# Patient Record
Sex: Female | Born: 1990 | Race: Black or African American | Hispanic: No | Marital: Married | Smoking: Former smoker
Health system: Southern US, Community
[De-identification: ages and names within clinical notes are randomized; demographics above are authoritative.]

## PROBLEM LIST (undated history)

## (undated) ENCOUNTER — Inpatient Hospital Stay (HOSPITAL_COMMUNITY): Payer: Self-pay

## (undated) DIAGNOSIS — O139 Gestational [pregnancy-induced] hypertension without significant proteinuria, unspecified trimester: Secondary | ICD-10-CM

## (undated) DIAGNOSIS — F419 Anxiety disorder, unspecified: Secondary | ICD-10-CM

## (undated) DIAGNOSIS — N83209 Unspecified ovarian cyst, unspecified side: Secondary | ICD-10-CM

## (undated) DIAGNOSIS — B977 Papillomavirus as the cause of diseases classified elsewhere: Secondary | ICD-10-CM

## (undated) DIAGNOSIS — R87629 Unspecified abnormal cytological findings in specimens from vagina: Secondary | ICD-10-CM

## (undated) DIAGNOSIS — Z8619 Personal history of other infectious and parasitic diseases: Secondary | ICD-10-CM

## (undated) HISTORY — PX: OTHER SURGICAL HISTORY: SHX169

## (undated) HISTORY — PX: WISDOM TOOTH EXTRACTION: SHX21

## (undated) HISTORY — DX: Gestational (pregnancy-induced) hypertension without significant proteinuria, unspecified trimester: O13.9

---

## 2010-10-05 ENCOUNTER — Encounter
Admission: RE | Admit: 2010-10-05 | Discharge: 2010-10-05 | Payer: Self-pay | Source: Home / Self Care | Attending: Obstetrics and Gynecology | Admitting: Obstetrics and Gynecology

## 2011-11-27 ENCOUNTER — Emergency Department (HOSPITAL_COMMUNITY)
Admission: EM | Admit: 2011-11-27 | Discharge: 2011-11-27 | Disposition: A | Discharge status: Home / Self Care | Payer: 59 | Attending: Emergency Medicine | Admitting: Emergency Medicine

## 2011-11-27 ENCOUNTER — Encounter (HOSPITAL_COMMUNITY): Payer: Self-pay | Admitting: Emergency Medicine

## 2011-11-27 DIAGNOSIS — E860 Dehydration: Secondary | ICD-10-CM | POA: Insufficient documentation

## 2011-11-27 DIAGNOSIS — R10819 Abdominal tenderness, unspecified site: Secondary | ICD-10-CM | POA: Insufficient documentation

## 2011-11-27 DIAGNOSIS — N946 Dysmenorrhea, unspecified: Secondary | ICD-10-CM

## 2011-11-27 DIAGNOSIS — R112 Nausea with vomiting, unspecified: Secondary | ICD-10-CM | POA: Insufficient documentation

## 2011-11-27 DIAGNOSIS — K117 Disturbances of salivary secretion: Secondary | ICD-10-CM | POA: Insufficient documentation

## 2011-11-27 DIAGNOSIS — F17200 Nicotine dependence, unspecified, uncomplicated: Secondary | ICD-10-CM | POA: Insufficient documentation

## 2011-11-27 DIAGNOSIS — R109 Unspecified abdominal pain: Secondary | ICD-10-CM | POA: Insufficient documentation

## 2011-11-27 DIAGNOSIS — F411 Generalized anxiety disorder: Secondary | ICD-10-CM | POA: Insufficient documentation

## 2011-11-27 DIAGNOSIS — R197 Diarrhea, unspecified: Secondary | ICD-10-CM

## 2011-11-27 LAB — COMPREHENSIVE METABOLIC PANEL
AST: 17 U/L (ref 0–37)
BUN: 5 mg/dL — ABNORMAL LOW (ref 6–23)
CO2: 24 mEq/L (ref 19–32)
Calcium: 8.7 mg/dL (ref 8.4–10.5)
Creatinine, Ser: 0.76 mg/dL (ref 0.50–1.10)
GFR calc Af Amer: >90 mL/min (ref >90)
GFR calc non Af Amer: >90 mL/min (ref >90)
Glucose, Bld: 129 mg/dL — ABNORMAL HIGH (ref 70–99)
Total Bilirubin: 0.4 mg/dL (ref 0.3–1.2)

## 2011-11-27 LAB — HCG, QUANTITATIVE, PREGNANCY: hCG, Beta Chain, Quant, S: <1 m[IU]/mL (ref <5)

## 2011-11-27 LAB — DIFFERENTIAL
Basophils Absolute: 0 10*3/uL (ref 0.0–0.1)
Eosinophils Relative: 1 % (ref 0–5)
Lymphocytes Relative: 16 % (ref 12–46)
Monocytes Absolute: 0.4 10*3/uL (ref 0.1–1.0)
Monocytes Relative: 6 % (ref 3–12)

## 2011-11-27 LAB — CBC
HCT: 35.9 % — ABNORMAL LOW (ref 36.0–46.0)
Hemoglobin: 12.6 g/dL (ref 12.0–15.0)
MCV: 91.1 fL (ref 78.0–100.0)
RDW: 13 % (ref 11.5–15.5)
WBC: 6.2 10*3/uL (ref 4.0–10.5)

## 2011-11-27 MED ORDER — ONDANSETRON HCL 4 MG/2ML IJ SOLN
4.0000 mg | INTRAMUSCULAR | Status: DC | PRN
  Administered 2011-11-27: 4 mg via INTRAVENOUS
  Filled 2011-11-27: qty 2

## 2011-11-27 MED ORDER — SODIUM CHLORIDE 0.9 % IV SOLN: INTRAVENOUS | Status: DC

## 2011-11-27 MED ORDER — SODIUM CHLORIDE 0.9 % IV BOLUS (SEPSIS)
1000.0000 mL | Freq: Once | INTRAVENOUS | Status: AC
  Administered 2011-11-27: 1000 mL via INTRAVENOUS

## 2011-11-27 MED ORDER — PROMETHAZINE HCL 25 MG RE SUPP: 25.0000 mg | Freq: Four times a day (QID) | RECTAL | Status: AC | PRN

## 2011-11-27 MED ORDER — PROMETHAZINE HCL 25 MG/ML IJ SOLN
25.0000 mg | Freq: Once | INTRAMUSCULAR | Status: AC
  Administered 2011-11-27: 25 mg via INTRAVENOUS
  Filled 2011-11-27: qty 1

## 2011-11-27 NOTE — Discharge Instructions (Signed)
Drink plenty of fluids (clear liquids) the next 12-24 hours then start the BRAT diet. . Use the phenergan for nausea or vomiting. Take imodium OTC for diarrhea. Avoid mild products until the diarrhea is gone. Recheck if you get worse again.

## 2011-11-27 NOTE — ED Notes (Signed)
ZOX:[id number]<AV> Expected date:11/27/11<BR> Expected time: 2:43 PM<BR> Means of arrival:Ambulance<BR> Comments:<BR> N/v/fever

## 2011-11-27 NOTE — ED Notes (Signed)
Pt states she got her period today and thought she was cramping.  She began having bilateral lower quadrant pain.  N/V/D

## 2011-11-27 NOTE — ED Notes (Signed)
Left A/C IV removed. Catheter intact at this time. The patients vitals taken d/c instructions given

## 2011-11-27 NOTE — ED Provider Notes (Signed)
History     CSN: [id number]  Arrival date & time 11/27/11  1455   First MD Initiated Contact with Patient 11/27/11 1503      Chief Complaint  Patient presents with  . Nausea  . Emesis  . Diarrhea    (Consider location/radiation/quality/duration/timing/severity/associated sxs/prior treatment) HPI  Patient relates sometimes about once year she will have a lot of lower abdominal pain with nausea and diarrhea when she starts her period. She relates she started her period this morning. 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 She states to have some cramping to a she's had nausea with vomiting x3 and diarrhea x5 that is loose and watery. She denies being around anybody else is sick. She states the pain started in the epigastric area and now is in the lower abdomen. She states she is G0 P0 and her last normal period was about 24 days ago. She states she does use condoms for birth control.  PCP none  History reviewed. No pertinent past medical history.  No past surgical history on file.  No family history on file.  History  Substance Use Topics  . Smoking status: Current Everyday Smoker  . Smokeless tobacco: Not on file  . Alcohol Use: Yes  employed  OB History    Grav Para Term Preterm Abortions TAB SAB Ect Mult Living                  Review of Systems  All other systems reviewed and are negative.    Allergies  Review of patient's allergies indicates no known allergies.  Home Medications   Current Outpatient Rx  Name Route Sig Dispense Refill  . PROMETHAZINE HCL 25 MG RE SUPP Rectal Place 1 suppository (25 mg total) rectally every 6 (six) hours as needed for nausea. 6 each 0    BP 112/60  Pulse 60  Temp 97.6 F (36.4 C)  LMP 11/27/2011  Vital signs normal     Physical Exam  Nursing note and vitals reviewed. Constitutional: She is oriented to person, place, and time. She appears well-developed and well-nourished.  Non-toxic appearance. She does not appear ill. She appears distressed.       Patient has just vomited one into the room.  HENT:  Head: Normocephalic and atraumatic.  Right Ear: External ear normal.  Left Ear: External ear normal.  Nose: Nose normal. No mucosal edema or rhinorrhea.  Mouth/Throat: Mucous membranes are normal. No dental abscesses or uvula swelling.       Mucous membranes dry  Eyes: Conjunctivae and EOM are normal. Pupils are equal, round, and reactive to light.  Neck: Normal range of motion and full passive range of motion without pain. Neck supple.  Cardiovascular: Normal rate, regular rhythm and normal heart sounds.  Exam reveals no gallop and no friction rub.   No murmur heard. Pulmonary/Chest: Effort normal and breath sounds normal. No respiratory distress. She has no wheezes. She has no rhonchi. She has no rales. She exhibits no tenderness and no crepitus.  Abdominal: Soft. Normal appearance and bowel sounds are normal. She exhibits no distension. There is tenderness. There is no rebound and no guarding.       Her abdomen is soft with diffuse tenderness without guarding or rebound. She has no focal tenderness on exam.  Musculoskeletal: Normal range of motion. She exhibits no edema and no tenderness.       Moves all extremities well.   Neurological: She is alert and oriented to person, place, and time. She has normal strength. No cranial nerve deficit.  Skin: Skin is warm, dry and intact. No rash noted. No erythema. No pallor.  Psychiatric: Her speech is normal and behavior is normal. Her mood appears not anxious.       Anxious    ED Course  Procedures (including critical care time)   Medications  0.9 %  sodium chloride infusion (not administered)  ondansetron (ZOFRAN) injection 4 mg (4 mg Intravenous Given  11/27/11 1532)  sodium chloride 0.9 % bolus 1,000 mL (1000 mL Intravenous Given 11/27/11 1533)  promethazine (PHENERGAN) injection 25 mg (25 mg Intravenous Given 11/27/11 1618)  sodium chloride 0.9 % bolus 1,000 mL (1000 mL Intravenous Given 11/27/11 1754)   Recheck 1700 sleeping, no more vomiting, will give another bolus of fluids.   18:30 walking to the bathroom, smiling, states she is feeling a lot better.    Results for orders placed during the hospital encounter of 11/27/11  CBC      Component Value Range   WBC 6.2  4.0 - 10.5 (K/uL)   RBC 3.94  3.87 - 5.11 (MIL/uL)   Hemoglobin 12.6  12.0 - 15.0 (g/dL)   HCT 16.1 (*) 09.6 - 46.0 (%)   MCV 91.1  78.0 - 100.0 (fL)   MCH 32.0  26.0 - 34.0 (pg)   MCHC 35.1  30.0 - 36.0 (g/dL)   RDW 04.5  40.9 - 81.1 (%)   Platelets 140 (*) 150 - 400 (K/uL)  DIFFERENTIAL      Component Value Range   Neutrophils Relative 76  43 - 77 (%)   Neutro Abs 4.8  1.7 - 7.7 (K/uL)   Lymphocytes Relative 16  12 - 46 (%)   Lymphs Abs 1.0  0.7 - 4.0 (K/uL)   Monocytes Relative 6  3 - 12 (%)   Monocytes Absolute 0.4  0.1 - 1.0 (K/uL)   Eosinophils Relative 1  0 - 5 (%)   Eosinophils Absolute 0.1  0.0 - 0.7 (K/uL)   Basophils Relative 0  0 - 1 (%)   Basophils Absolute 0.0  0.0 - 0.1 (K/uL)  COMPREHENSIVE METABOLIC PANEL      Component Value Range   Sodium 142  135 - 145 (mEq/L)   Potassium 3.6  3.5 - 5.1 (mEq/L)   Chloride 109  96 - 112 (mEq/L)   CO2 24  19 - 32 (mEq/L)   Glucose, Bld 129 (*) 70 - 99 (mg/dL)   BUN 5 (*) 6 - 23 (mg/dL)   Creatinine, Ser 9.14  0.50 - 1.10 (mg/dL)   Calcium 8.7  8.4 - 78.2 (mg/dL)   Total Protein 6.7  6.0 - 8.3 (g/dL)   Albumin 4.2  3.5 - 5.2 (g/dL)   AST 17  0 - 37 (U/L)   ALT 11  0 - 35 (U/L)   Alkaline Phosphatase 63  39 - 117 (U/L)   Total Bilirubin 0.4  0.3 - 1.2 (mg/dL)   GFR calc non Af Amer >90  >90 (mL/min)   GFR calc Af Amer >90  >90 (mL/min)  HCG, QUANTITATIVE, PREGNANCY      Component Value Range   hCG,  Beta Chain, Quant, S <1  <5 (mIU/mL)   Laboratory interpretation all normal   No results found.  1. Nausea vomiting and diarrhea   2. Dehydration   3. Menses painful    New Prescriptions   PROMETHAZINE (PHENERGAN) 25 MG SUPPOSITORY    Place 1 suppository (25 mg total) rectally every 6 (six) hours as needed for nausea.   Plan discharge Devoria Albe, MD, FACEP    MDM          Ward Givens, MD 11/27/11 [phone]

## 2014-01-20 ENCOUNTER — Ambulatory Visit (INDEPENDENT_AMBULATORY_CARE_PROVIDER_SITE_OTHER): Payer: BC Managed Care – PPO | Admitting: Family Medicine

## 2014-01-20 VITALS — BP 130/90 | HR 71 | Temp 98.1°F | Resp 16 | Ht 60.75 in | Wt 114.0 lb

## 2014-01-20 DIAGNOSIS — N926 Irregular menstruation, unspecified: Secondary | ICD-10-CM

## 2014-01-20 DIAGNOSIS — N912 Amenorrhea, unspecified: Secondary | ICD-10-CM

## 2014-01-20 LAB — POCT URINE PREGNANCY: Preg Test, Ur: NEGATIVE

## 2014-01-20 NOTE — Progress Notes (Signed)
This chart was scribed for Elvina SidleKurt Lauenstein, MD by Nicholos Johnsenise Iheanachor, Medical Scribe. This patient's care was started at 5:06 PM   Patient ID: Sherry CrockerSherrena Pena MRN: [id number], DOB: 02/11/1991, 23 y.o. Date of Encounter: 01/20/2014, 5:05 PM  Primary Physician: No PCP Per Patient  Chief Complaint: Pregnancy Test  HPI: 23 y.o. year old female with history below presents with a late period; says she is never late; if anything she is usually early. States she is having intermittent abdominal cramping and feels hungrier than normal. Pt is hoping she is pregnant; states this will be her first child if positive. Last period was December 20, 2013. Says she took two home pregnancy tests that both came back negative. [facility] because her mother tested negative using a home pregnancy test up until her 5th month of pregnancy.  States she is happy and not stressed. 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 Denies vaginal bleeding.   History reviewed. No pertinent past medical history.   Home Meds: Prior to Admission medications   Not on File    Allergies: No Known Allergies  History   Social History   Marital Status: Single    Spouse Name: N/A    Number of Children: N/A   Years of Education: N/A   Occupational History   Not on file.   Social History Main Topics   Smoking status: Current Every Day Smoker   Smokeless tobacco: Not on file   Alcohol Use: Yes   Drug Use: Yes    Special: Marijuana   Sexual Activity:    Other Topics Concern   Not on file   Social History Narrative   No narrative on file     Review of Systems: Constitutional: negative for chills, fever, night sweats, weight changes, or fatigue  HEENT: negative for vision changes, hearing loss, congestion, rhinorrhea, ST, epistaxis, or sinus pressure Cardiovascular: negative for chest pain or palpitations Respiratory: negative for hemoptysis, wheezing, shortness of  breath, or cough Abdominal: negative for nausea, vomiting, diarrhea, or constipation. Abdominal cramping Dermatological: negative for rash Neurologic: negative for headache, dizziness, or syncope All other systems reviewed and are otherwise negative with the exception to those above and in the HPI.   Physical Exam: Blood pressure 130/90, pulse 71, temperature 98.1 F (36.7 C), temperature source Oral, resp. rate 16, height 5' 0.75" (1.543 m), weight 114 lb (51.71 kg), last menstrual period 12/20/2013, SpO2 99.00%., Body mass index is 21.72 kg/(m^2). General: Well developed, well nourished, in no acute distress. Head: Normocephalic, atraumatic, eyes without discharge, sclera non-icteric, nares are without discharge. Bilateral auditory canals clear, TM's are without perforation, pearly grey and translucent with reflective cone of light bilaterally. Oral cavity moist, posterior pharynx without exudate, erythema, peritonsillar abscess, or post nasal drip.  Neck: Supple. No thyromegaly. Full ROM. No lymphadenopathy. Lungs: Clear bilaterally to auscultation without wheezes, rales, or rhonchi. Breathing is unlabored. Heart: RRR with S1 S2. No murmurs, rubs, or gallops appreciated. Abdomen: Soft, non-tender, non-distended with normoactive bowel sounds. No hepatomegaly. No rebound/guarding. No obvious abdominal masses. Msk:  Strength and tone normal for age. Extremities/Skin: Warm and dry. No clubbing or cyanosis. No edema. No rashes or suspicious lesions. Neuro: Alert and oriented X 3. Moves all extremities spontaneously. Gait is normal. CNII-XII grossly in tact. Psych:  Responds to questions appropriately with a normal affect.   Labs: Results for orders placed in visit on 01/20/14  POCT URINE PREGNANCY  Result Value Ref Range   Preg Test, Ur Negative       ASSESSMENT AND PLAN:  23 y.o. year old female with amenorrhea.   Missed period - Plan: POCT urine pregnancy, hCG, quantitative,  pregnancy  Signed, Elvina SidleKurt Lauenstein, MD     Signed, Elvina SidleKurt Lauenstein, MD 01/20/2014 5:05 PM

## 2014-01-20 NOTE — Patient Instructions (Signed)
Primary Amenorrhea  Primary amenorrhea is the absence of any menstrual flow in a female by the age of 15 years. An average age for the start of menstruation is the age of 12 years. Primary amenorrhea is not considered to have occurred until a female is older than 15 years and has never menstruated. This may occur with or without other signs of puberty. CAUSES  Some common causes of not menstruating include:  Chromosomal abnormality causing the ovaries to malfunction is the most common cause of primary amenorrhea.  Malnutrition.  Low blood sugar (hypoglycemia).  Polycystic ovary syndrome (cysts in the ovaries, not ovulating).  Absence of the vagina, uterus, or ovaries since birth (congenital).  Extreme obesity.  Cystic fibrosis.  Drastic weight loss from any cause.  Over-exercising (running, biking) causing loss of body fat.  Pituitary gland tumor in the brain.  Long-term (chronic) illnesses.  Cushing disease.  Thyroid disease (hypothyroidism, hyperthyroidism).  Part of the brain (hypothalamus) not functioning normally.  Premature ovarian failure. SYMPTOMS  No menstruation by age 15 years in normally developed females is the primary symptom. Other symptoms may include:  Discharge from the breasts.  Hot flashes.  Adult acne.  Facial or chest hair.  Headaches.  Impaired vision.  Recent stress.  Changes in weight, diet, or exercise patterns. DIAGNOSIS  Primary amenorrhea is diagnosed with the help of a medical history and a physical exam. Other tests that may be recommended include:  Blood tests to check for pregnancy, hormonal changes, a bleeding or thyroid disorder, low iron levels (anemia), or other problems.  Urine tests.  Specialized X-ray exams. TREATMENT  Treatment will depend on the cause. For example, some of the causes of primary amenorrhea, such as congenital absence of sex organs, will require surgery to correct. Others may respond to treatment  with medicine. SEEK MEDICAL CARE IF:  There has not been any menstrual flow by age 15 years.  Body maturation does not occur at a level typical of peers.  Pelvic area pain occurs.  There is unusual weight gain or hair growth. Document Released: 08/27/2005 Document Revised: 06/17/2013 Document Reviewed: 04/08/2013 ExitCare Patient Information 2014 [employer].  

## 2014-01-21 LAB — HCG, QUANTITATIVE, PREGNANCY: hCG, Beta Chain, Quant, S: 10 m[IU]/mL

## 2014-01-23 ENCOUNTER — Ambulatory Visit (INDEPENDENT_AMBULATORY_CARE_PROVIDER_SITE_OTHER): Payer: BC Managed Care – PPO | Admitting: Internal Medicine

## 2014-01-23 VITALS — BP 110/80 | HR 69 | Temp 98.3°F | Resp 16 | Ht 60.5 in | Wt 112.4 lb

## 2014-01-23 DIAGNOSIS — Z3201 Encounter for pregnancy test, result positive: Secondary | ICD-10-CM

## 2014-01-23 DIAGNOSIS — N912 Amenorrhea, unspecified: Secondary | ICD-10-CM

## 2014-01-23 LAB — HCG, QUANTITATIVE, PREGNANCY: HCG, BETA CHAIN, QUANT, S: 15.1 m[IU]/mL

## 2014-01-23 NOTE — Progress Notes (Signed)
   Subjective:    Patient ID: Sherry Pena, female    DOB: 04/07/1991, 23 y.o.   MRN: [id number]  This chart was scribed for Ellamae Siaobert Dakwan Pridgen, MD by Bronson CurbJacqueline Melvin, ED Scribe. This patient was seen in room Room 8 and the patient's care was started at 9:22 AM.   HPI  HPI Comments: Sherry Pena is a 23 y.o. female who returns to the [facility] for repeat quant pregnancy test. See last OV with low pos HCG. She did A urine pregnacy test at home this am- resulted positive, however she reports she would like to have a blood test performed so that she may have a better grasp of dates.  She states she feels "ill" with nausea,fatigue and br tend.  Patient is a current every day smoker and consumes alcohol. LNMP April 14th.   Review of Systems Otherwise neg    Objective:   Physical Exam  Nursing note and vitals reviewed. Constitutional: She is oriented to person, place, and time. She appears well-developed and well-nourished. No distress.  HENT:  Head: Normocephalic and atraumatic.  Eyes: EOM are normal.  Neck: Neck supple.  Cardiovascular: Normal rate.   Pulmonary/Chest: Effort normal. No respiratory distress.  Musculoskeletal: Normal range of motion.  Neurological: She is alert and oriented to person, place, and time.  Skin: Skin is warm and dry.  Psychiatric: She has a normal mood and affect. Her behavior is normal.  BP 110/80  Pulse 69  Temp(Src) 98.3 F (36.8 C) (Oral)  Resp 16  Ht 5' 0.5" (1.537 m)  Wt 112 lb 6.4 oz (50.984 kg)  BMI 21.58 kg/m2  SpO2 100%  LMP 12/20/2013         Assessment & Plan:  Amenorrhea  Positive pregnancy test by hx - Plan: hCG, quantitative, pregnancy  Disc-smok cessation--choose cold [place] since only 4-5/day etoh cessation prenat vit Ref to OB   I have completed the patient encounter in its entirety as documented by the scribe, with editing by me where necessary. Shanyla Marconi P. Merla Richesoolittle, M.D.

## 2014-01-25 ENCOUNTER — Inpatient Hospital Stay (HOSPITAL_COMMUNITY)
Admission: AD | Admit: 2014-01-25 | Discharge: 2014-01-25 | Disposition: A | Discharge status: Home / Self Care | Payer: BC Managed Care – PPO | Source: Ambulatory Visit | Attending: Obstetrics & Gynecology | Admitting: Obstetrics & Gynecology

## 2014-01-25 ENCOUNTER — Encounter (HOSPITAL_COMMUNITY): Payer: Self-pay | Admitting: *Deleted

## 2014-01-25 ENCOUNTER — Encounter: Payer: Self-pay | Admitting: Obstetrics & Gynecology

## 2014-01-25 DIAGNOSIS — O4690 Antepartum hemorrhage, unspecified, unspecified trimester: Secondary | ICD-10-CM

## 2014-01-25 DIAGNOSIS — O99330 Smoking (tobacco) complicating pregnancy, unspecified trimester: Secondary | ICD-10-CM | POA: Insufficient documentation

## 2014-01-25 DIAGNOSIS — O98519 Other viral diseases complicating pregnancy, unspecified trimester: Secondary | ICD-10-CM | POA: Insufficient documentation

## 2014-01-25 DIAGNOSIS — O039 Complete or unspecified spontaneous abortion without complication: Secondary | ICD-10-CM

## 2014-01-25 DIAGNOSIS — O200 Threatened abortion: Secondary | ICD-10-CM | POA: Insufficient documentation

## 2014-01-25 DIAGNOSIS — B977 Papillomavirus as the cause of diseases classified elsewhere: Secondary | ICD-10-CM | POA: Insufficient documentation

## 2014-01-25 HISTORY — DX: Personal history of other infectious and parasitic diseases: Z86.19

## 2014-01-25 HISTORY — DX: Papillomavirus as the cause of diseases classified elsewhere: B97.7

## 2014-01-25 LAB — CBC
HEMATOCRIT: 37.9 % (ref 36.0–46.0)
HEMOGLOBIN: 13.6 g/dL (ref 12.0–15.0)
MCH: 32.8 pg (ref 26.0–34.0)
MCHC: 35.9 g/dL (ref 30.0–36.0)
MCV: 91.3 fL (ref 78.0–100.0)
Platelets: 159 10*3/uL (ref 150–400)
RBC: 4.15 MIL/uL (ref 3.87–5.11)
RDW: 12.5 % (ref 11.5–15.5)
WBC: 5 10*3/uL (ref 4.0–10.5)

## 2014-01-25 LAB — WET PREP, GENITAL
CLUE CELLS WET PREP: NONE SEEN
Trich, Wet Prep: NONE SEEN
YEAST WET PREP: NONE SEEN

## 2014-01-25 LAB — ABO/RH: ABO/RH(D): O POS

## 2014-01-25 LAB — HCG, QUANTITATIVE, PREGNANCY: HCG, BETA CHAIN, QUANT, S: 6 m[IU]/mL — AB (ref <5)

## 2014-01-25 NOTE — Discharge Instructions (Signed)
Miscarriage  A miscarriage is the loss of an unborn baby (fetus) before the 20th week of pregnancy. The cause is often unknown.   HOME CARE  · You may need to stay in bed (bed rest), or you may be able to do light activity. Go about activity as told by your doctor.  · Have help at home.  · Write down how many pads you use each day. Write down how soaked they are.  · Do not use tampons. Do not wash out your vagina (douche) or have sex (intercourse) until your doctor approves.  · Only take medicine as told by your doctor.  · Do not take aspirin.  · Keep all doctor visits as told.  · If you or your partner have problems with grieving, talk to your doctor. You can also try counseling. Give yourself time to grieve before trying to get pregnant again.  GET HELP RIGHT AWAY IF:  · You have bad cramps or pain in your back or belly (abdomen).  · You have a fever.  · You pass large clumps of blood (clots) from your vagina that are walnut-sized or larger. Save the clumps for your doctor to see.  · You pass large amounts of tissue from your vagina. Save the tissue for your doctor to see.  · You have more bleeding.  · You have thick, bad-smelling fluid (discharge) coming from the vagina.  · You get lightheaded, weak, or you pass out (faint).  · You have chills.  MAKE SURE YOU:  · Understand these instructions.  · Will watch your condition.  · Will get help right away if you are not doing well or get worse.  Document Released: 11/19/2011 Document Reviewed: 11/19/2011  ExitCare® Patient Information ©2014 [employer].

## 2014-01-25 NOTE — MAU Provider Note (Signed)
History     CSN: [id number]  Arrival date and time: 01/25/14 0830   First Provider Initiated Contact with Patient 01/25/14 0930      Chief Complaint  Patient presents with  . Vaginal Bleeding   HPI Comments: Sherry Pena is a 23 y.o. G1P0 who is 5018w1d with recent low Hcg levels who presents with bleeding and cramping. She had been seen recently for pregnancy verification and had been followed 48 hours for beta hcg levels.  The bleeding began this morning about 1 hour before she presented to MAU. She describes the bleeding as spotting, brownish-pink. Before the bleeding she noted a white discharge. Her cramping also began this morning, is felt in her lower abdomen, and feels like her "period is about to come on, but not as strong," and is a 2/10 on pain scale. She denies fevers, chills, dizziness/lightheadedness, N/V, urinary or bowel changes. She and her fiance are not necessarily trying to get pregnant, but they are open to having a child.  Vaginal Bleeding Associated symptoms include abdominal pain. Pertinent negatives include no chills, constipation, diarrhea, dysuria, fever, frequency, headaches, hematuria, nausea, urgency or vomiting.   Beta hcg levels:  5/13: 10 5/16: 15 5/18: 6  Past Medical History  Diagnosis Date  . HPV (human papilloma virus) infection   . History of genital warts     Past Surgical History  Procedure Laterality Date  . Genital warts      Laser removal  . Wisdom tooth extraction      History reviewed. No pertinent family history.  History  Substance Use Topics  . Smoking status: Current Every Day Smoker    Types: Cigarettes  . Smokeless tobacco: Never Used  . Alcohol Use: Yes    Allergies: No Known Allergies  Prescriptions prior to admission  Medication Sig Dispense Refill  . Prenatal Vit-Fe Fumarate-FA (PRENATAL MULTIVITAMIN) TABS tablet Take 1 tablet by mouth daily at 12 noon.       Results for orders placed during the hospital  encounter of 01/25/14 (from the past 48 hour(s))  ABO/RH     Status: None   Collection Time    01/25/14  9:20 AM      Result Value Ref Range   ABO/RH(D) O POS    HCG, QUANTITATIVE, PREGNANCY     Status: Abnormal   Collection Time    01/25/14  9:20 AM      Result Value Ref Range   hCG, Beta Chain, Quant, S 6 (*) <5 mIU/mL   Comment:              GEST. AGE      CONC.  (mIU/mL)       <=1 WEEK        5 - 50         2 WEEKS       50 - 500         3 WEEKS       100 - 10,000         4 WEEKS     1,000 - 30,000         5 WEEKS     3,500 - 115,000       6-8 WEEKS     12,000 - 270,000        12 WEEKS     15,000 - 220,000                FEMALE AND NON-PREGNANT FEMALE:  LESS THAN 5 mIU/mL  CBC     Status: None   Collection Time    01/25/14  9:22 AM      Result Value Ref Range   WBC 5.0  4.0 - 10.5 K/uL   RBC 4.15  3.87 - 5.11 MIL/uL   Hemoglobin 13.6  12.0 - 15.0 g/dL   HCT 11.937.9  14.736.0 - 82.946.0 %   MCV 91.3  78.0 - 100.0 fL   MCH 32.8  26.0 - 34.0 pg   MCHC 35.9  30.0 - 36.0 g/dL   RDW 56.212.5  13.011.5 - 86.515.5 %   Platelets 159  150 - 400 K/uL  WET PREP, GENITAL     Status: Abnormal   Collection Time    01/25/14  9:53 AM      Result Value Ref Range   Yeast Wet Prep HPF POC NONE SEEN  NONE SEEN   Trich, Wet Prep NONE SEEN  NONE SEEN   Clue Cells Wet Prep HPF POC NONE SEEN  NONE SEEN   WBC, Wet Prep HPF POC FEW (*) NONE SEEN   Comment: MODERATE BACTERIA SEEN    Review of Systems  Constitutional: Negative for fever and chills.  Gastrointestinal: Positive for abdominal pain. Negative for heartburn, nausea, vomiting, diarrhea and constipation.  Genitourinary: Positive for vaginal bleeding. Negative for dysuria, urgency, frequency and hematuria.  Neurological: Negative for dizziness and headaches.   Physical Exam   Blood pressure 129/90, pulse 63, temperature 98.7 F (37.1 C), resp. rate 17, height 5' (1.524 m), weight 51.71 kg (114 lb), last menstrual period 12/20/2013.  Physical  Exam  Constitutional: She appears well-developed.  Cardiovascular: Normal rate.   Respiratory: Effort normal.  GI: Soft. She exhibits no distension and no mass. There is no tenderness. There is no guarding.  Genitourinary: Cervix exhibits discharge (bloody). Cervix exhibits no motion tenderness and no friability. Right adnexum displays no mass and no tenderness. Left adnexum displays no mass and no tenderness. No tenderness around the vagina. Vaginal discharge (bloody) found.  Cervix is closed    MAU Course  Procedures None  MDM CBC, Abo Rh, Hcg to r/o ectopic, miscarriage U/S if Hcg is high enough to r/o ectopic Speculum exam with cultures to r/o infection O positive blood type  Consulted with Dr. Penne LashLeggett  Assessment and Plan  Assessment Low Hcg; declining Hcg,  likely miscarriage Nontender abdominal and pelvic exam, less likely ectopic  Plan F/u quant Hcg in 2 weeks in the clinic Bleeding precautions Pt to return to MAU with increased bleeding or pain.  Support given   Maryfrances BunnellStephanie Agoncillo 01/25/2014, 9:36 AM   Evaluation and management procedures were performed by the PA student under my supervision and collaboration. I have reviewed the note and chart, and I agree with the management and plan.  Iona HansenJennifer Irene Derk Doubek, NP 01/25/2014 11:13 AM

## 2014-01-25 NOTE — MAU Note (Signed)
States was late for her period so she went to urgent care. HCG was low. Repeated 2 days later. Does not know results. States she started having some pink bleeding about an hour prior to arrival to MAU. Very mild cramping.

## 2014-01-26 ENCOUNTER — Telehealth: Payer: Self-pay | Admitting: *Deleted

## 2014-01-26 ENCOUNTER — Encounter: Payer: Self-pay | Admitting: *Deleted

## 2014-01-26 LAB — GC/CHLAMYDIA PROBE AMP
CT Probe RNA: NEGATIVE
GC Probe RNA: NEGATIVE

## 2014-01-26 NOTE — Telephone Encounter (Signed)
Pt called nurse line requesting letter for work stating she was at the hospital yesterday for miscarriage and needs to be excused from work yesterday and today.  Spoke with patient about letter to be excused from work.  Letter typed and will be upfront before 12 on 01/26/2014.

## 2014-01-29 NOTE — MAU Provider Note (Signed)
Attestation of Attending Supervision of Advanced Practitioner (CNM/NP): Evaluation and management procedures were performed by the Advanced Practitioner under my supervision and collaboration. I have reviewed the Advanced Practitioner's note and chart, and I agree with the management and plan.  Sherry Pena 10:56 AM

## 2014-02-08 ENCOUNTER — Other Ambulatory Visit: Payer: BC Managed Care – PPO

## 2014-02-11 ENCOUNTER — Inpatient Hospital Stay (HOSPITAL_COMMUNITY)
Admission: AD | Admit: 2014-02-11 | Discharge: 2014-02-11 | Discharge status: Against Medical Advice | Payer: BC Managed Care – PPO | Attending: Obstetrics and Gynecology | Admitting: Obstetrics and Gynecology

## 2014-02-11 ENCOUNTER — Other Ambulatory Visit: Payer: BC Managed Care – PPO

## 2014-02-11 DIAGNOSIS — N939 Abnormal uterine and vaginal bleeding, unspecified: Secondary | ICD-10-CM

## 2014-02-12 LAB — HCG, QUANTITATIVE, PREGNANCY: hCG, Beta Chain, Quant, S: <2 m[IU]/mL

## 2014-02-15 ENCOUNTER — Telehealth: Payer: Self-pay

## 2014-02-15 NOTE — Telephone Encounter (Signed)
Message copied by Louanna Raw on Mon Feb 15, 2014 10:59 AM ------      Message from: Lesly Dukes      Created: Sat Feb 13, 2014 10:09 AM       Pt has blue cross blue shield.  I would refer to [facility] or [place] for reproductive needs (see prior inbasket message). ------

## 2014-02-15 NOTE — Telephone Encounter (Signed)
Attempted to call patient. Straight to voicemail. Left message stating we are calling with results, please call clinic.

## 2014-02-15 NOTE — Telephone Encounter (Signed)
Message copied by Louanna Raw on Mon Feb 15, 2014 10:59 AM ------      Message from: Lesly Dukes      Created: Sat Feb 13, 2014 10:09 AM       Beta <2.  Pt no longer pregnant.  RN to call patient and refer to inform of result and refer to health dept for birth control needs ASAP if she desires birth control ------

## 2014-02-16 NOTE — Telephone Encounter (Signed)
Called patient and informed of results. Patient verbalized understanding and gratitude. Gave patient Sherry Pena number to call and schedule appointment. No questions or concerns.

## 2014-03-09 ENCOUNTER — Telehealth: Payer: Self-pay

## 2014-03-09 NOTE — Telephone Encounter (Signed)
Spoke with patient about getting an appointment either at [facility] or [facility]. Pt voice that [facility] will be closer but her insurance has run out and will pick back up in august. She will call [facility] to make appointment.

## 2014-07-12 ENCOUNTER — Encounter (HOSPITAL_COMMUNITY): Payer: Self-pay | Admitting: *Deleted

## 2014-09-10 NOTE — L&D Delivery Note (Signed)
Pt was admitted in early labor. She progressed along a nl labor curve. She had a decel after epidural placed , txd with ephedrine. She pushed for one hour. While pushing she had variable decels. These worsened as she was pushing. When crowning the VE was placed to expidite the delivery. She pushed x 1 had a vaginal delivery over a 3rd degree midline tear in the ROA position. Placenta S/I , tight twist in cord at base near placenta. Tear closed with 2-0 Vicryl and 3-0 Chromic. EBL- 400 cc.Baby to NBN.

## 2014-11-30 ENCOUNTER — Encounter (HOSPITAL_COMMUNITY): Payer: Self-pay | Admitting: *Deleted

## 2014-12-08 ENCOUNTER — Ambulatory Visit (INDEPENDENT_AMBULATORY_CARE_PROVIDER_SITE_OTHER): Payer: BC Managed Care – PPO | Admitting: Family Medicine

## 2014-12-08 VITALS — BP 112/62 | HR 74 | Temp 98.2°F | Resp 18 | Ht 62.0 in | Wt 114.0 lb

## 2014-12-08 DIAGNOSIS — Z720 Tobacco use: Secondary | ICD-10-CM | POA: Diagnosis not present

## 2014-12-08 DIAGNOSIS — F17200 Nicotine dependence, unspecified, uncomplicated: Secondary | ICD-10-CM

## 2014-12-08 DIAGNOSIS — R21 Rash and other nonspecific skin eruption: Secondary | ICD-10-CM

## 2014-12-08 DIAGNOSIS — Z3169 Encounter for other general counseling and advice on procreation: Secondary | ICD-10-CM

## 2014-12-08 LAB — POCT URINE PREGNANCY: PREG TEST UR: NEGATIVE

## 2014-12-08 LAB — POCT SKIN KOH: Skin KOH, POC: NEGATIVE

## 2014-12-08 MED ORDER — TRIAMCINOLONE ACETONIDE 0.1 % EX OINT: 1.0000 "application " | TOPICAL_OINTMENT | Freq: Two times a day (BID) | CUTANEOUS | Status: DC

## 2014-12-08 NOTE — Patient Instructions (Addendum)
I think that you have severe eczema of your hands.  Use the steroid ointment once or twice a day, and use a thick moisturizer cream or ointment such as aquaphor or cetaphil frequently.  Minimize exposure to water and wash your hands thoroughly   Good luck with quitting smoking!  I agree that this is a great idea for your health in general.  Try substituting another ritual such a a piece of candy or gum  You can also try the Quitline [place] for help and support  As you are trying to concieve, start taking a pre- natal vitamin now to make sure your body has enough folic acid Your test today is negative, but if you do not get your period take another test in 5-7 days.

## 2014-12-08 NOTE — Progress Notes (Signed)
[facility] [address], [place] [address] [phone]  Date:  12/08/2014   Name:  Sherry Pena   DOB:  [birth year]   MRN:  [id number]  PCP:  No PCP Per Patient    Chief Complaint: blisters   History of Present Illness:  Sherry Pena is a 24 y.o. very pleasant female patient who presents with the following:  Generally healthy young lady here today with a problem with both palms.  She has noted dryness and blistering for about 3 weeks. She has a history of eczema, which she has tried to treat with healthy diet and exercise.   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 They have a tradition of shaking hands with all students and then she needs to wash her hands frequently  She has tried treating it with neosporin, cortisone cream, vinegar At this time the rest of her skin is quiet, she does not have any other eczema.    She does smoke 6-7 cigs a day; 2-3 packs a week that she will share.  She would like to quit soon but is not sure where to start  She had a miscarriage last year and is trying to conceive again.  She and her fiance are not using any contraception.  She notes that her LMP was about 1 month ago, but she does not suspect that she is pregnant now. She is taking a regular vitamin but not a PNV right now  There are no active problems to display for this patient.   Past Medical History  Diagnosis Date  . HPV (human papilloma virus) infection   . History of genital warts     Past Surgical History  Procedure Laterality Date  . Genital warts      Laser removal  . Wisdom tooth extraction      History  Substance Use Topics  . Smoking status: Current Every Day Smoker    Types: Cigarettes  . Smokeless tobacco: Never Used  . Alcohol Use: 0.0 oz/week    0 Standard drinks or equivalent per week    History reviewed. No pertinent family history.  No Known Allergies  Medication list has been reviewed and  updated.  No current outpatient prescriptions on file prior to visit.   No current facility-administered medications on file prior to visit.    Review of Systems:  As per HPI- otherwise negative.   Physical Examination: Filed Vitals:   12/08/14 0827  BP: 112/62  Pulse: 74  Temp: 98.2 F (36.8 C)  Resp: 18   Filed Vitals:   12/08/14 0827  Height:  (1.575 m)  Weight: 114 lb (51.71 kg)   Body mass index is 20.85 kg/(m^2). Ideal Body Weight: Weight in (lb) to have BMI = 25: 136.4  GEN: WDWN, NAD, Non-toxic, A & O x 3, normal weight, looks well HEENT: Atraumatic, Normocephalic. Neck supple. No masses, No LAD. Ears and Nose: No external deformity. CV: RRR, No M/G/R. No JVD. No thrill. No extra heart sounds. PULM: CTA B, no wheezes, crackles, rhonchi. No retractions. No resp. distress. No accessory muscle use. EXTR: No c/c/e NEURO Normal gait.  PSYCH: Normally interactive. Conversant. Not depressed or anxious appearing.  Calm demeanor.  Hands: she has a very dry rash on the palmar aspect/ lateral aspects of some of her fingers on both hands. Consistent with dyshidrotic eczema.   No other rash or lesion on her skin Results for  orders placed or performed in visit on 12/08/14  POCT Skin KOH  Result Value Ref Range   Skin KOH, POC Negative   POCT urine pregnancy  Result Value Ref Range   Preg Test, Ur Negative      Assessment and Plan: Rash of hands - Plan: POCT Skin KOH, triamcinolone ointment (KENALOG) 0.1 %, POCT urine pregnancy  Smoking  Pre-conception counseling given UTD article of dyshidrotic eczema.  Steroid ointment, discussed conservative treatment as well such as water avoidance See patient instructions for more details.      Signed Abbe AmsterdamJessica Winfield Caba, MD

## 2014-12-19 ENCOUNTER — Ambulatory Visit (INDEPENDENT_AMBULATORY_CARE_PROVIDER_SITE_OTHER): Payer: BC Managed Care – PPO | Admitting: Physician Assistant

## 2014-12-19 VITALS — BP 126/78 | HR 87 | Temp 98.4°F | Resp 17 | Ht 62.0 in | Wt 105.0 lb

## 2014-12-19 DIAGNOSIS — Z3200 Encounter for pregnancy test, result unknown: Secondary | ICD-10-CM

## 2014-12-19 DIAGNOSIS — N926 Irregular menstruation, unspecified: Secondary | ICD-10-CM | POA: Diagnosis not present

## 2014-12-19 DIAGNOSIS — Z3490 Encounter for supervision of normal pregnancy, unspecified, unspecified trimester: Secondary | ICD-10-CM

## 2014-12-19 LAB — POCT URINE PREGNANCY: PREG TEST UR: POSITIVE

## 2014-12-19 NOTE — Patient Instructions (Signed)
First Trimester of Pregnancy The first trimester of pregnancy is from week 1 until the end of week 12 (months 1 through 3). A week after a sperm fertilizes an egg, the egg will implant on the wall of the uterus. This embryo will begin to develop into a baby. Genes from you and your partner are forming the baby. The female genes determine whether the baby is a boy or a girl. At 6-8 weeks, the eyes and face are formed, and the heartbeat can be seen on ultrasound. At the end of 12 weeks, all the baby's organs are formed.  Now that you are pregnant, you will want to do everything you can to have a healthy baby. Two of the most important things are to get good prenatal care and to follow your health care provider's instructions. Prenatal care is all the medical care you receive before the baby's birth. This care will help prevent, find, and treat any problems during the pregnancy and childbirth. BODY CHANGES Your body goes through many changes during pregnancy. The changes vary from woman to woman.   You may gain or lose a couple of pounds at first.  You may feel sick to your stomach (nauseous) and throw up (vomit). If the vomiting is uncontrollable, call your health care provider.  You may tire easily.  You may develop headaches that can be relieved by medicines approved by your health care provider.  You may urinate more often. Painful urination may mean you have a bladder infection.  You may develop heartburn as a result of your pregnancy.  You may develop constipation because certain hormones are causing the muscles that push waste through your intestines to slow down.  You may develop hemorrhoids or swollen, bulging veins (varicose veins).  Your breasts may begin to grow larger and become tender. Your nipples may stick out more, and the tissue that surrounds them (areola) may become darker.  Your gums may bleed and may be sensitive to brushing and flossing.  Dark spots or blotches (chloasma,  mask of pregnancy) may develop on your face. This will likely fade after the baby is born.  Your menstrual periods will stop.  You may have a loss of appetite.  You may develop cravings for certain kinds of food.  You may have changes in your emotions from day to day, such as being excited to be pregnant or being concerned that something may go wrong with the pregnancy and baby.  You may have more vivid and strange dreams.  You may have changes in your hair. These can include thickening of your hair, rapid growth, and changes in texture. Some women also have hair loss during or after pregnancy, or hair that feels dry or thin. Your hair will most likely return to normal after your baby is born. WHAT TO EXPECT AT YOUR PRENATAL VISITS During a routine prenatal visit:  You will be weighed to make sure you and the baby are growing normally.  Your blood pressure will be taken.  Your abdomen will be measured to track your baby's growth.  The fetal heartbeat will be listened to starting around week 10 or 12 of your pregnancy.  Test results from any previous visits will be discussed. Your health care provider may ask you:  How you are feeling.  If you are feeling the baby move.  If you have had any abnormal symptoms, such as leaking fluid, bleeding, severe headaches, or abdominal cramping.  If you have any questions. Other tests   that may be performed during your first trimester include:  Blood tests to find your blood type and to check for the presence of any previous infections. They will also be used to check for low iron levels (anemia) and Rh antibodies. Later in the pregnancy, blood tests for diabetes will be done along with other tests if problems develop.  Urine tests to check for infections, diabetes, or protein in the urine.  An ultrasound to confirm the proper growth and development of the baby.  An amniocentesis to check for possible genetic problems.  Fetal screens for  spina bifida and Down syndrome.  You may need other tests to make sure you and the baby are doing well. HOME CARE INSTRUCTIONS  Medicines  Follow your health care provider's instructions regarding medicine use. Specific medicines may be either safe or unsafe to take during pregnancy.  Take your prenatal vitamins as directed.  If you develop constipation, try taking a stool softener if your health care provider approves. Diet  Eat regular, well-balanced meals. Choose a variety of foods, such as meat or vegetable-based protein, fish, milk and low-fat dairy products, vegetables, fruits, and whole grain breads and cereals. Your health care provider will help you determine the amount of weight gain that is right for you.  Avoid raw meat and uncooked cheese. These carry germs that can cause birth defects in the baby.  Eating four or five small meals rather than three large meals a day may help relieve nausea and vomiting. If you start to feel nauseous, eating a few soda crackers can be helpful. Drinking liquids between meals instead of during meals also seems to help nausea and vomiting.  If you develop constipation, eat more high-fiber foods, such as fresh vegetables or fruit and whole grains. Drink enough fluids to keep your urine clear or pale yellow. Activity and Exercise  Exercise only as directed by your health care provider. Exercising will help you:  Control your weight.  Stay in shape.  Be prepared for labor and delivery.  Experiencing pain or cramping in the lower abdomen or low back is a good sign that you should stop exercising. Check with your health care provider before continuing normal exercises.  Try to avoid standing for long periods of time. Move your legs often if you must stand in one place for a long time.  Avoid heavy lifting.  Wear low-heeled shoes, and practice good posture.  You may continue to have sex unless your health care provider directs you  otherwise. Relief of Pain or Discomfort  Wear a good support bra for breast tenderness.   Take warm sitz baths to soothe any pain or discomfort caused by hemorrhoids. Use hemorrhoid cream if your health care provider approves.   Rest with your legs elevated if you have leg cramps or low back pain.  If you develop varicose veins in your legs, wear support hose. Elevate your feet for 15 minutes, 3-4 times a day. Limit salt in your diet. Prenatal Care  Schedule your prenatal visits by the twelfth week of pregnancy. They are usually scheduled monthly at first, then more often in the last 2 months before delivery.  Write down your questions. Take them to your prenatal visits.  Keep all your prenatal visits as directed by your health care provider. Safety  Wear your seat belt at all times when driving.  Make a list of emergency phone numbers, including numbers for family, friends, the hospital, and police and fire departments. General Tips    Ask your health care provider for a referral to a local prenatal education class. Begin classes no later than at the beginning of month 6 of your pregnancy.  Ask for help if you have counseling or nutritional needs during pregnancy. Your health care provider can offer advice or refer you to specialists for help with various needs.  Do not use hot tubs, steam rooms, or saunas.  Do not douche or use tampons or scented sanitary pads.  Do not cross your legs for long periods of time.  Avoid cat litter boxes and soil used by cats. These carry germs that can cause birth defects in the baby and possibly loss of the fetus by miscarriage or stillbirth.  Avoid all smoking, herbs, alcohol, and medicines not prescribed by your health care provider. Chemicals in these affect the formation and growth of the baby.  Schedule a dentist appointment. At home, brush your teeth with a soft toothbrush and be gentle when you floss. SEEK MEDICAL CARE IF:   You have  dizziness.  You have mild pelvic cramps, pelvic pressure, or nagging pain in the abdominal area.  You have persistent nausea, vomiting, or diarrhea.  You have a bad smelling vaginal discharge.  You have pain with urination.  You notice increased swelling in your face, hands, legs, or ankles. SEEK IMMEDIATE MEDICAL CARE IF:   You have a fever.  You are leaking fluid from your vagina.  You have spotting or bleeding from your vagina.  You have severe abdominal cramping or pain.  You have rapid weight gain or loss.  You vomit blood or material that looks like coffee grounds.  You are exposed to German measles and have never had them.  You are exposed to fifth disease or chickenpox.  You develop a severe headache.  You have shortness of breath.  You have any kind of trauma, such as from a fall or a car accident. Document Released: 08/21/2001 Document Revised: 01/11/2014 Document Reviewed: 07/07/2013 ExitCare Patient Information 2015 [employer]. This information is not intended to replace advice given to you by your health care provider. Make sure you discuss any questions you have with your health care provider.  

## 2014-12-19 NOTE — Progress Notes (Signed)
Subjective:    Patient ID: Sherry Pena, female    DOB: 1991/07/06, 24 y.o.   MRN: [id number]  HPI  This is a 24 year old female who is presenting for a pregnancy test. She missed her period. She took 2 home pregnancy tests in the past few days that were positive. She is a smoker (4-5 cigarettes/day) but stopped as soon as she got a positive test. 1 year ago she states she had a "chemical pregnancy". She had missed her period but pregnancy tests were negative. A quant hcg showed a low positive hcg. She had a missed miscarriage. Otherwise, no other pregnancies. She lives with her boyfriend - they were planning to get married in October. They were not planning to get pregnant but they are both very excited. She is not having any symptoms of pregnancy yet - no N/V, fatigue or breast tenderness. She reports 6 days ago she had some mild light brown spotting but none since. She is taking a prenatal vitamin. She is wanting to see a mid-wife for her prenatal care.  Review of Systems  Constitutional: Negative for fever, chills and fatigue.  Gastrointestinal: Negative for nausea and vomiting.  Genitourinary: Positive for menstrual problem. Negative for dysuria, vaginal discharge and pelvic pain.       Light spotting  Skin: Negative for rash.  Neurological: Negative for dizziness.    There are no active problems to display for this patient.  Prior to Admission medications   Medication Sig Start Date End Date Taking? Authorizing Provider  Prenatal Vit-Fe Fumarate-FA (PRENATAL MULTIVITAMIN) TABS tablet Take 1 tablet by mouth daily at 12 noon.   Yes Historical Provider, MD  triamcinolone ointment (KENALOG) 0.1 % Apply 1 application topically 2 (two) times daily. 12/08/14  Yes Pearline Cables, MD          No Known Allergies  Patient's social and family history were reviewed.     Objective:   Physical Exam  Constitutional: She is oriented to person, place, and time. She appears well-developed  and well-nourished. No distress.  HENT:  Head: Normocephalic and atraumatic.  Right Ear: Hearing normal.  Left Ear: Hearing normal.  Nose: Nose normal.  Eyes: Conjunctivae and lids are normal. Right eye exhibits no discharge. Left eye exhibits no discharge. No scleral icterus.  Cardiovascular: Normal rate, regular rhythm, normal heart sounds and normal pulses.   No murmur heard. Pulmonary/Chest: Effort normal and breath sounds normal. No respiratory distress.  Abdominal: Soft. Normal appearance. There is no tenderness.  Musculoskeletal: Normal range of motion.  Neurological: She is alert and oriented to person, place, and time.  Skin: Skin is warm, dry and intact. No lesion and no rash noted.  Psychiatric: She has a normal mood and affect. Her speech is normal and behavior is normal. Thought content normal.   BP 126/78 mmHg  Pulse 87  Temp(Src) 98.4 F (36.9 C) (Oral)  Resp 17  Ht  (1.575 m)  Wt 105 lb (47.628 kg)  BMI 19.20 kg/m2  SpO2 100%  LMP 11/18/2014  Breastfeeding? No  Results for orders placed or performed in visit on 12/19/14  POCT urine pregnancy  Result Value Ref Range   Preg Test, Ur Positive       Assessment & Plan:  1. Missed period 2. Pregnancy Pregnancy test positive. We discussed what to expect in the first trimester and what to avoid. Referral to OB/GYN placed. Advised if she experiences any more spotting she should return to  be seen.  - POCT urine pregnancy - Ambulatory referral to Obstetrics / Gynecology   Roswell MinersNicole V. Dyke BrackettBush, PA-C, MHS [facility]  12/19/2014

## 2015-02-17 LAB — OB RESULTS CONSOLE HEPATITIS B SURFACE ANTIGEN: Hepatitis B Surface Ag: NEGATIVE

## 2015-02-17 LAB — OB RESULTS CONSOLE ANTIBODY SCREEN: ANTIBODY SCREEN: NEGATIVE

## 2015-02-17 LAB — OB RESULTS CONSOLE PLATELET COUNT: Platelets: 183 10*3/uL

## 2015-02-17 LAB — OB RESULTS CONSOLE HGB/HCT, BLOOD
HCT: 36 %
HEMOGLOBIN: 12.1 g/dL

## 2015-02-17 LAB — OB RESULTS CONSOLE RPR: RPR: NONREACTIVE

## 2015-02-17 LAB — OB RESULTS CONSOLE HIV ANTIBODY (ROUTINE TESTING): HIV: NONREACTIVE

## 2015-02-17 LAB — OB RESULTS CONSOLE RUBELLA ANTIBODY, IGM: RUBELLA: IMMUNE

## 2015-07-22 LAB — OB RESULTS CONSOLE GBS: GBS: NEGATIVE

## 2015-07-22 LAB — OB RESULTS CONSOLE GC/CHLAMYDIA
Chlamydia: NEGATIVE
Gonorrhea: NEGATIVE

## 2015-08-13 ENCOUNTER — Encounter (HOSPITAL_COMMUNITY): Payer: Self-pay | Admitting: *Deleted

## 2015-08-13 ENCOUNTER — Inpatient Hospital Stay (HOSPITAL_COMMUNITY): Payer: BC Managed Care – PPO | Admitting: Anesthesiology

## 2015-08-13 ENCOUNTER — Inpatient Hospital Stay (HOSPITAL_COMMUNITY)
Admission: AD | Admit: 2015-08-13 | Discharge: 2015-08-16 | DRG: 775 | Disposition: A | Discharge status: Home / Self Care | Payer: BC Managed Care – PPO | Source: Ambulatory Visit | Attending: Obstetrics and Gynecology | Admitting: Obstetrics and Gynecology

## 2015-08-13 DIAGNOSIS — O99324 Drug use complicating childbirth: Secondary | ICD-10-CM | POA: Diagnosis present

## 2015-08-13 DIAGNOSIS — F1290 Cannabis use, unspecified, uncomplicated: Secondary | ICD-10-CM | POA: Diagnosis present

## 2015-08-13 DIAGNOSIS — Z3480 Encounter for supervision of other normal pregnancy, unspecified trimester: Secondary | ICD-10-CM

## 2015-08-13 DIAGNOSIS — Z87891 Personal history of nicotine dependence: Secondary | ICD-10-CM | POA: Diagnosis not present

## 2015-08-13 DIAGNOSIS — Z3A38 38 weeks gestation of pregnancy: Secondary | ICD-10-CM | POA: Diagnosis not present

## 2015-08-13 DIAGNOSIS — IMO0001 Reserved for inherently not codable concepts without codable children: Secondary | ICD-10-CM

## 2015-08-13 LAB — CBC
HEMATOCRIT: 32.9 % — AB (ref 36.0–46.0)
HEMOGLOBIN: 11.2 g/dL — AB (ref 12.0–15.0)
MCH: 28.1 pg (ref 26.0–34.0)
MCHC: 34 g/dL (ref 30.0–36.0)
MCV: 82.5 fL (ref 78.0–100.0)
Platelets: 246 10*3/uL (ref 150–400)
RBC: 3.99 MIL/uL (ref 3.87–5.11)
RDW: 14.3 % (ref 11.5–15.5)
WBC: 12.2 10*3/uL — ABNORMAL HIGH (ref 4.0–10.5)

## 2015-08-13 LAB — TYPE AND SCREEN
ABO/RH(D): O POS
Antibody Screen: NEGATIVE

## 2015-08-13 MED ORDER — MEASLES, MUMPS & RUBELLA VAC ~~LOC~~ INJ
0.5000 mL | INJECTION | Freq: Once | SUBCUTANEOUS | Status: DC
  Filled 2015-08-13: qty 0.5

## 2015-08-13 MED ORDER — PRENATAL MULTIVITAMIN CH
1.0000 | ORAL_TABLET | Freq: Every day | ORAL | Status: DC
  Administered 2015-08-14 – 2015-08-15 (×2): 1 via ORAL
  Filled 2015-08-13 (×2): qty 1

## 2015-08-13 MED ORDER — EPHEDRINE 5 MG/ML INJ
10.0000 mg | INTRAVENOUS | Status: DC | PRN
  Filled 2015-08-13: qty 4

## 2015-08-13 MED ORDER — OXYTOCIN BOLUS FROM INFUSION: 500.0000 mL | INTRAVENOUS | Status: DC

## 2015-08-13 MED ORDER — BUTORPHANOL TARTRATE 1 MG/ML IJ SOLN
1.0000 mg | INTRAMUSCULAR | Status: DC | PRN
  Administered 2015-08-13 (×2): 1 mg via INTRAVENOUS
  Filled 2015-08-13 (×2): qty 1

## 2015-08-13 MED ORDER — OXYCODONE-ACETAMINOPHEN 5-325 MG PO TABS
2.0000 | ORAL_TABLET | ORAL | Status: DC | PRN
  Administered 2015-08-15: 2 via ORAL
  Filled 2015-08-13: qty 2

## 2015-08-13 MED ORDER — FLEET ENEMA 7-19 GM/118ML RE ENEM: 1.0000 | ENEMA | RECTAL | Status: DC | PRN

## 2015-08-13 MED ORDER — LIDOCAINE HCL (PF) 1 % IJ SOLN
30.0000 mL | INTRAMUSCULAR | Status: DC | PRN
  Filled 2015-08-13: qty 30

## 2015-08-13 MED ORDER — ONDANSETRON HCL 4 MG PO TABS: 4.0000 mg | ORAL_TABLET | ORAL | Status: DC | PRN

## 2015-08-13 MED ORDER — ACETAMINOPHEN 325 MG PO TABS: 650.0000 mg | ORAL_TABLET | ORAL | Status: DC | PRN

## 2015-08-13 MED ORDER — ZOLPIDEM TARTRATE 5 MG PO TABS
5.0000 mg | ORAL_TABLET | Freq: Every evening | ORAL | Status: DC | PRN
Start: 2015-08-13 — End: 2015-08-16

## 2015-08-13 MED ORDER — SIMETHICONE 80 MG PO CHEW: 80.0000 mg | CHEWABLE_TABLET | ORAL | Status: DC | PRN

## 2015-08-13 MED ORDER — OXYCODONE-ACETAMINOPHEN 5-325 MG PO TABS: 2.0000 | ORAL_TABLET | ORAL | Status: DC | PRN

## 2015-08-13 MED ORDER — SENNOSIDES-DOCUSATE SODIUM 8.6-50 MG PO TABS
2.0000 | ORAL_TABLET | ORAL | Status: DC
  Administered 2015-08-14 – 2015-08-16 (×2): 2 via ORAL
  Filled 2015-08-13 (×2): qty 2

## 2015-08-13 MED ORDER — PHENYLEPHRINE 40 MCG/ML (10ML) SYRINGE FOR IV PUSH (FOR BLOOD PRESSURE SUPPORT)
80.0000 ug | PREFILLED_SYRINGE | INTRAVENOUS | Status: DC | PRN
  Administered 2015-08-13 (×2): 80 ug via INTRAVENOUS
  Filled 2015-08-13: qty 20

## 2015-08-13 MED ORDER — ONDANSETRON HCL 4 MG/2ML IJ SOLN: 4.0000 mg | Freq: Four times a day (QID) | INTRAMUSCULAR | Status: DC | PRN

## 2015-08-13 MED ORDER — IBUPROFEN 600 MG PO TABS
600.0000 mg | ORAL_TABLET | Freq: Four times a day (QID) | ORAL | Status: DC
  Administered 2015-08-13 – 2015-08-16 (×10): 600 mg via ORAL
  Filled 2015-08-13 (×10): qty 1

## 2015-08-13 MED ORDER — FENTANYL 2.5 MCG/ML BUPIVACAINE 1/10 % EPIDURAL INFUSION (WH - ANES)
14.0000 mL/h | INTRAMUSCULAR | Status: DC | PRN
  Administered 2015-08-13: 14 mL/h via EPIDURAL
  Filled 2015-08-13: qty 125

## 2015-08-13 MED ORDER — ONDANSETRON HCL 4 MG/2ML IJ SOLN: 4.0000 mg | INTRAMUSCULAR | Status: DC | PRN

## 2015-08-13 MED ORDER — BENZOCAINE-MENTHOL 20-0.5 % EX AERO
1.0000 "application " | INHALATION_SPRAY | CUTANEOUS | Status: DC | PRN
  Administered 2015-08-13: 1 via TOPICAL
  Filled 2015-08-13 (×2): qty 56

## 2015-08-13 MED ORDER — OXYCODONE-ACETAMINOPHEN 5-325 MG PO TABS: 1.0000 | ORAL_TABLET | ORAL | Status: DC | PRN

## 2015-08-13 MED ORDER — OXYCODONE-ACETAMINOPHEN 5-325 MG PO TABS
1.0000 | ORAL_TABLET | ORAL | Status: DC | PRN
  Administered 2015-08-14 – 2015-08-16 (×3): 1 via ORAL
  Filled 2015-08-13 (×3): qty 1

## 2015-08-13 MED ORDER — CITRIC ACID-SODIUM CITRATE 334-500 MG/5ML PO SOLN: 30.0000 mL | ORAL | Status: DC | PRN

## 2015-08-13 MED ORDER — LIDOCAINE HCL (PF) 1 % IJ SOLN
INTRAMUSCULAR | Status: DC | PRN
  Administered 2015-08-13 (×2): 5 mL
  Administered 2015-08-13: 3 mL

## 2015-08-13 MED ORDER — WITCH HAZEL-GLYCERIN EX PADS
1.0000 "application " | MEDICATED_PAD | CUTANEOUS | Status: DC | PRN
  Administered 2015-08-14: 1 via TOPICAL

## 2015-08-13 MED ORDER — LACTATED RINGERS IV SOLN
500.0000 mL | INTRAVENOUS | Status: DC | PRN
  Administered 2015-08-13 (×2): 500 mL via INTRAVENOUS

## 2015-08-13 MED ORDER — DIBUCAINE 1 % RE OINT
1.0000 "application " | TOPICAL_OINTMENT | RECTAL | Status: DC | PRN
  Filled 2015-08-13: qty 28

## 2015-08-13 MED ORDER — DIPHENHYDRAMINE HCL 50 MG/ML IJ SOLN: 12.5000 mg | INTRAMUSCULAR | Status: DC | PRN

## 2015-08-13 MED ORDER — OXYTOCIN 40 UNITS IN LACTATED RINGERS INFUSION - SIMPLE MED
62.5000 mL/h | INTRAVENOUS | Status: DC
  Administered 2015-08-13: 62.5 mL/h via INTRAVENOUS
  Filled 2015-08-13: qty 1000

## 2015-08-13 MED ORDER — TETANUS-DIPHTH-ACELL PERTUSSIS 5-2.5-18.5 LF-MCG/0.5 IM SUSP
0.5000 mL | Freq: Once | INTRAMUSCULAR | Status: DC
  Filled 2015-08-13: qty 0.5

## 2015-08-13 MED ORDER — LACTATED RINGERS IV SOLN
INTRAVENOUS | Status: DC
  Administered 2015-08-13: 12:00:00 via INTRAVENOUS

## 2015-08-13 NOTE — MAU Note (Addendum)
Been up since 0130 with contractions, getting closer and stronger.  Was 2+/100 this week.   First baby, no problems with preg

## 2015-08-13 NOTE — MAU Note (Signed)
Notified Dr. Dareen PianoAnderson patient G3P0  [id number] labor contractions 3 to 5, uncomfortable, cervix 4-5/100/+1 station, BBOW, received admit orders.

## 2015-08-13 NOTE — Anesthesia Preprocedure Evaluation (Signed)

## 2015-08-13 NOTE — Anesthesia Procedure Notes (Signed)
Epidural Patient location during procedure: OB  Staffing Anesthesiologist: Makilah Dowda Performed by: anesthesiologist   Preanesthetic Checklist Completed: patient identified, site marked, surgical consent, pre-op evaluation, timeout performed, IV checked, risks and benefits discussed and monitors and equipment checked  Epidural Patient position: sitting Prep: DuraPrep Patient monitoring: heart rate, continuous pulse ox and blood pressure Approach: right paramedian Location: L4-L5 Injection technique: LOR saline  Needle:  Needle type: Tuohy  Needle gauge: 17 G Needle length: 9 cm and 9 Needle insertion depth: 7 cm Catheter type: closed end flexible Catheter size: 20 Guage Catheter at skin depth: 11 cm Test dose: negative  Assessment Events: blood not aspirated, injection not painful, no injection resistance, negative IV test and no paresthesia  Additional Notes Patient identified. Risks/Benefits/Options discussed with patient including but not limited to bleeding, infection, nerve damage, paralysis, failed block, incomplete pain control, headache, blood pressure changes, nausea, vomiting, reactions to medication both or allergic, itching and postpartum back pain. Confirmed with bedside nurse the patient's most recent platelet count. Confirmed with patient that they are not currently taking any anticoagulation, have any bleeding history or any family history of bleeding disorders. Patient expressed understanding and wished to proceed. All questions were answered. Sterile technique was used throughout the entire procedure. Please see nursing notes for vital signs. Test dose was given through epidural needle and negative prior to continuing to dose epidural or start infusion. Warning signs of high block given to the patient including shortness of breath, tingling/numbness in hands, complete motor block, or any concerning symptoms with instructions to call for help. Patient was given  instructions on fall risk and not to get out of bed. All questions and concerns addressed with instructions to call with any issues.   

## 2015-08-13 NOTE — H&P (Signed)
Pt is a 24 y/o black female G3P0020 at term who was admitted in early labor. Her PNC was complicated by SS trait. Partner is SS neg. On admission she was 4cm.  PMHX: see hollister PE: VSSAF        HEENT-wnl        ABD-gravid, non tender        FHTs reactive IMP/ IUP at term in labor Plan/ Admit

## 2015-08-14 LAB — RPR: RPR Ser Ql: NONREACTIVE

## 2015-08-14 LAB — CBC
HCT: 26.4 % — ABNORMAL LOW (ref 36.0–46.0)
HEMOGLOBIN: 9 g/dL — AB (ref 12.0–15.0)
MCH: 27.7 pg (ref 26.0–34.0)
MCHC: 34.1 g/dL (ref 30.0–36.0)
MCV: 81.2 fL (ref 78.0–100.0)
Platelets: 196 10*3/uL (ref 150–400)
RBC: 3.25 MIL/uL — AB (ref 3.87–5.11)
RDW: 14.4 % (ref 11.5–15.5)
WBC: 14.3 10*3/uL — ABNORMAL HIGH (ref 4.0–10.5)

## 2015-08-14 MED ORDER — DOCUSATE SODIUM 100 MG PO CAPS
100.0000 mg | ORAL_CAPSULE | Freq: Every day | ORAL | Status: DC
  Administered 2015-08-14 – 2015-08-16 (×3): 100 mg via ORAL
  Filled 2015-08-14 (×3): qty 1

## 2015-08-14 NOTE — Progress Notes (Signed)
CLINICAL SOCIAL WORK MATERNAL/CHILD NOTE  Patient Details  Name: Sherry Pena MRN: [id number] Date of Birth: 08/13/2017  Date: 08/14/2015  Clinical Social Worker Initiating Note: Bailie Christenbury, LCSWDate/ Time Initiated: 08/14/15/1200   Child's Name: Sherry Pena   Legal Guardian:  (Parents Sherry Pena and Sherry Pena)   Need for Interpreter: None   Date of Referral: 08/13/15   Reason for Referral: Other (Comment)   Referral Source: [facility]   Address: [address] [place], [place] [address]  Phone number:  [phone])   Household Members: Significant Other   Natural Supports (not living in the home): Immediate Family, Extended Family, Friends   Professional Supports:None   Employment:Full-time   Type of Work: Patent examiner   Education:     Printmaker   Other Resources:     Cultural/Religious Considerations Which May Impact Care: non noted Strengths: Ability to meet basic needs , Home prepared for child , Pediatrician chosen    Risk Factors/Current Problems:  (Newborn's UDS was positive)   Cognitive State: Alert , Able to Concentrate    Mood/Affect: Happy    CSW Assessment: Acknowledged order for social work consult to address concerns regarding hx of Marijuana use. Met with mother who was pleasant and receptive to CSW. She is a single parent with no other dependents. She and FOB cohabitate. Mother admits to occasional use of marijuana 3 years ago prior to becoming a Education officer, museum. Informed that she quit using completely because of her job. Informed that during thanksgiving dinner, she had a brownie that had marijuana and did not realized it at the time. She denies any other illicit drug use during pregnancy. Mother was informed of the hospital's drug screen policy and reason for the testing. UDS on newborn is positive and mother informed of  referral that will be made to DSS. Mother denies any hx of mental illness. Informed her of social work Fish farm manager.   CSW Plan/Description:    Case referred to DSS. Spoke with Kennon Portela and informed that DSS will follow up with mother in the community.  No barriers to discharge.  Plan is for newborn to return home with mother and DSS will follow up at home.  Babita Amaker J, LCSW 08/14/2015, 3:20 PM

## 2015-08-14 NOTE — Progress Notes (Signed)
PPD#1 Pt without complaints. Per social worker baby was + for marijuana. Mother told her that it must have been in brownies eaten at Thanksgiving. VSSAF B/Ps were increased , now wnl. IMP/ Stable Plan/ routine care.

## 2015-08-14 NOTE — Anesthesia Postprocedure Evaluation (Signed)
Anesthesia Post Note  Patient: Sherry Pena  Procedure(s) Performed: * No procedures listed *  Patient location during evaluation: Mother Baby Anesthesia Type: Epidural Level of consciousness: awake and alert, oriented and patient cooperative Pain management: pain level controlled Vital Signs Assessment: post-procedure vital signs reviewed and stable Respiratory status: spontaneous breathing Cardiovascular status: stable Postop Assessment: no headache, epidural receding, patient able to bend at knees and no signs of nausea or vomiting Anesthetic complications: no    Last Vitals:  Filed Vitals:   08/13/15 2315 08/14/15 0216  BP: 120/70 132/63  Pulse: 82 86  Temp: 37.1 C 36.9 C  Resp: 20 20    Last Pain:  Filed Vitals:   08/14/15 0643  PainSc: 5                  Tinisha Etzkorn

## 2015-08-14 NOTE — Lactation Note (Addendum)
This note was copied from the chart of Sherry Pena. Lactation Consultation Note  Patient Name: Sherry Pena ZOXWR'UToday's Date: 08/14/2015 Reason for consult: Initial assessment;Infant < 6lbs;Other (Comment) (CBG 31)   Initial consult on 5 hour old infant born via vaginal delivery at 7138 w 2 d weighing 5 lb 14 oz. Infant now 4.5 hours old with CBG of 31 at 2145, infant has since been BF. Infant temperature also 98 degrees. Infant has been BF x 4 for 10-30 minutes. Repeat CBG at around 0000 was 44. Mom with small breasts that are difficult to compress. Right nipple is small, short shafted and everted, Left nipple is small and inverted at rest, everted with infant feeding. Taught mom to hand express, large gtts colostrum noted. Infant cueing to feed,  Infant latched easily, sometimes shallowly, discussed with mom the difference and need for deep latch to maximize milk transfer. Infant BF on both breasts in football hold needing stimulation to maintain suckling, reviewed awakening techniques. Mom massaging and compressing breast with feeding, swallows increased with massage. Discussed with mom that if CBG's remain lower for her to hand express both breasts every 2 hours and all EBM can be fed to infant via spoon or syringe. If blood glucose remains low may need DEBP set up. Advised parents to feed 8-12 x in 24 hours for a minimum of 15 minutes of active sucking. Advised to awaken at 2 hours to feed if infant not cueing to feed. Mom voiced understanding to all teaching and plan. LC Brochure and BF Resources handouts given, discussed BF Resources, OP Services and Support Groups. Enc family to maintain feeding log. Encouraged parents to place infant STS as much as possible. Updated CN Nurse with plan. Enc mom to call with questions/concerns or assistance with latching infant. Will follow up after 8 am when LC come on shift.   Maternal Data Formula Feeding for Exclusion: No Has patient been taught  Hand Expression?: Yes Does the patient have breastfeeding experience prior to this delivery?: No  Feeding Feeding Type: Breast Fed Length of feed: 15 min  LATCH Score/Interventions Latch: Grasps breast easily, tongue down, lips flanged, rhythmical sucking.  Audible Swallowing: A few with stimulation Intervention(s): Skin to skin;Hand expression  Type of Nipple: Everted at rest and after stimulation (right nipple everted, left nipple flat but everts with stimulation)  Comfort (Breast/Nipple): Soft / non-tender     Hold (Positioning): Assistance needed to correctly position infant at breast and maintain latch. Intervention(s): Breastfeeding basics reviewed;Support Pillows;Position options;Skin to skin  LATCH Score: 8  Lactation Tools Discussed/Used WIC Program: No   Consult Status Consult Status: Follow-up Date: 08/14/15 Follow-up type: In-patient    Sherry Pena 08/14/2015, 12:43 AM

## 2015-08-14 NOTE — Lactation Note (Signed)
This note was copied from the chart of Sherry Pena. Lactation Consultation Note  Mother in a lot of pain when breastfeeding. Oral assessment indicated mid posterior lingual frenulum and short labial frenulum. Baby sucks and bites.  Mother had revision when she had braces. Reviewed suck training.  Taught FOB how to perform chin tug when baby latches for increased depth and comfort. Repositioned baby to side lying.  With FOB doing chin tug at first feeding became tolerable. Provided mother w/ shells and suggest she alternate between shells and gels. Taught mother how to [place] baby if it becomes uncomfortable. Suggest if it become too painful she can pump if she chooses w/ DEBP.  Patient Name: Sherry Pena NWGNF'AToday's Date: 08/14/2015 Reason for consult: Follow-up assessment   Maternal Data    Feeding Feeding Type: Breast Fed  LATCH Score/Interventions Latch: Grasps breast easily, tongue down, lips flanged, rhythmical sucking.  Audible Swallowing: A few with stimulation Intervention(s): Skin to skin;Hand expression;Alternate breast massage  Type of Nipple: Everted at rest and after stimulation  Comfort (Breast/Nipple): Engorged, cracked, bleeding, large blisters, severe discomfort Problem noted: Cracked, bleeding, blisters, bruises Intervention(s): Expressed breast milk to nipple  Problem noted: Cracked, bleeding, blisters, bruises  Hold (Positioning): Assistance needed to correctly position infant at breast and maintain latch.  LATCH Score: 6  Lactation Tools Discussed/Used     Consult Status Consult Status: Follow-up Date: 08/15/15 Follow-up type: In-patient    Dahlia ByesBerkelhammer, Ruth [facility] 08/14/2015, 1:37 PM

## 2015-08-15 NOTE — Lactation Note (Signed)
This note was copied from the chart of Sherry Pena. Lactation Consultation Note  Patient Name: Sherry Pena ZOXWR'UToday's Date: 08/15/2015 Reason for consult: Follow-up assessment;Breast/nipple pain;Difficult latch;Infant weight loss Baby latched to right breast using #20 nipple shield. Baby sleepy but some good suckling bursts observed. Colostrum present in the nipple shield, nipple round when baby came off the breast. PS=8 with initial latching improving to 3-4. Mom latched baby to left breast using #20 nipple shield PS=10 improving to 7 with baby nursing. The left nipple is cracked at the base. Baby is noted to have short labial frenulum along with short lingual frenulum. Some chewing observed with suck exam and Mom reported chewing biting when not using nipple shield. Plan discussed with Mom is to BF with each feeding a minimum of 8-12 times in 24 hours for 15-20 minutes both breasts when possible, post pump for 15 minutes then supplement with 15 ml of EBM or formula with each feeding. Parents plan to use bottle to supplement. Call for assist.   Maternal Data    Feeding Feeding Type: Breast Fed Length of feed: 0 min  LATCH Score/Interventions Latch: Repeated attempts needed to sustain latch, nipple held in mouth throughout feeding, stimulation needed to elicit sucking reflex. (using #20 nipple shield) Intervention(s): Assist with latch;Adjust position;Breast compression  Audible Swallowing: A few with stimulation  Type of Nipple: Everted at rest and after stimulation  Comfort (Breast/Nipple): Engorged, cracked, bleeding, large blisters, severe discomfort Problem noted: Cracked, bleeding, blisters, bruises Intervention(s): Expressed breast milk to nipple  Problem noted: Severe discomfort Interventions  (Cracked/bleeding/bruising/blister): Expressed breast milk to nipple Interventions (Mild/moderate discomfort): Comfort gels  Hold (Positioning): Assistance needed to  correctly position infant at breast and maintain latch.  LATCH Score: 5  Lactation Tools Discussed/Used Tools: Nipple Dorris CarnesShields;Shells;Pump;Comfort gels Nipple shield size: 20 Shell Type: Inverted Breast pump type: Double-Electric Breast Pump   Consult Status Consult Status: Follow-up Date: 08/15/15 Follow-up type: In-patient    Alfred LevinsGranger, Kayle Correa Ann 08/15/2015, 12:50 PM

## 2015-08-15 NOTE — Progress Notes (Signed)
Post Partum Day 1 Subjective: no complaints, up ad lib, voiding and tolerating PO  Objective: Blood pressure 131/77, pulse 84, temperature 98.7 F (37.1 C), temperature source Oral, resp. rate 18, height 5' (1.524 m), weight 160 lb (72.576 kg), last menstrual period 11/18/2014, SpO2 100 %, unknown if currently breastfeeding.  Physical Exam:  General: alert, cooperative and appears stated age Lochia: appropriate Uterine Fundus: firm   Recent Labs  08/13/15 1155 08/14/15 0610  HGB 11.2* 9.0*  HCT 32.9* 26.4*    Assessment/Plan: Plan for discharge tomorrow   LOS: 2 days   Gwen Sarvis H. 08/15/2015, 8:30 AM

## 2015-08-15 NOTE — Lactation Note (Signed)
This note was copied from the chart of Sherry Pena. Lactation Consultation Note  Patient Name: Sherry Pena ZOXWR'UToday's Date: 08/15/2015 Reason for consult: Follow-up assessment;Breast/nipple pain Due to weight loss, Mom reports Dr. Karilyn CotaGosrani has ordered supplements of 15 ml EBM/formula with feedings. Mom has cracked nipples bilateral and is too sore to put baby to breast. Nipples are erect but flatten with breast compression, aerola edema present bilateral.  Mom is willing to try nipple shield to see if this helps. Attempted at this visit but baby too sleepy, recently had formula 32 ml. Set up DEBP for Mom to start post pumping, reviewed pump use/cleaning. Encouraged to wear shells to reduce nipple areola edema and for pain. Care for sore nipples discussed. Mom has comfort gels. Marijuana use with breastfeeding hand out given to Mom for review. Mom denies being smoker, reports was exposed thru brownies she ate at family members home over the holidays. LC left phone number for Mom to call with next feeding for assist with latch. Mom may need 2 week rental at d/c, has pump coming from [employer].   Maternal Data    Feeding Feeding Type: Breast Fed Length of feed: 0 min  LATCH Score/Interventions Latch: Too sleepy or reluctant, no latch achieved, no sucking elicited.                    Lactation Tools Discussed/Used Tools: Shells;Pump;Comfort gels;Nipple Shields Nipple shield size: 20 Shell Type: Inverted Breast pump type: Double-Electric Breast Pump   Consult Status Consult Status: Follow-up Date: 08/15/15 Follow-up type: In-patient    Alfred LevinsGranger, Judson Tsan Ann 08/15/2015, 10:56 AM

## 2015-08-16 MED ORDER — OXYCODONE-ACETAMINOPHEN 5-325 MG PO TABS: 1.0000 | ORAL_TABLET | ORAL | Status: DC | PRN

## 2015-08-16 MED ORDER — SENNOSIDES-DOCUSATE SODIUM 8.6-50 MG PO TABS: 2.0000 | ORAL_TABLET | ORAL | Status: DC

## 2015-08-16 NOTE — Discharge Summary (Signed)
Obstetric Discharge Summary Reason for Admission: onset of labor Prenatal Procedures: none Intrapartum Procedures: vacuum Postpartum Procedures: none Complications-Operative and Postpartum: 3 degree perineal laceration HEMOGLOBIN  Date Value Ref Range Status  08/14/2015 9.0* 12.0 - 15.0 g/dL Final  16/10/960406/05/2015 54.012.1 g/dL Final   HCT  Date Value Ref Range Status  08/14/2015 26.4* 36.0 - 46.0 % Final  02/17/2015 36 % Final    Discharge Diagnoses: Term Pregnancy-delivered  Discharge Information: Date: 08/16/2015 Activity: pelvic rest Diet: routine Medications: Ibuprofen, Percocet and senekot Condition: stable Instructions: refer to practice specific booklet Discharge to: home Follow-up Information    Follow up with Levi AlandANDERSON,MARK E, MD In 4 weeks.   Specialty:  Obstetrics and Gynecology   Contact information:   [address] [place] [id number] [phone]       Newborn Data: Live born female  Birth Weight: 5 lb 14 oz (2665 g) APGAR: 9, 9  Home with mother.  Rachard Isidro A 08/16/2015, 8:06 AM

## 2015-08-16 NOTE — Lactation Note (Signed)
This note was copied from the chart of Girl Jiles CrockerSherrena Frazier. Lactation Consultation Note  Patient Name: Girl Jiles CrockerSherrena Frazier ZOXWR'UToday's Date: 08/16/2015 Reason for consult: Follow-up assessment;Other (Comment) (mom pumping and bottle feeding , see LC note , 7% weight loss, at 52 hours 7.4 )  Per mom breast are feeling fuller and I was able to pump off 28 ml this am. LC mentioned to mom that is a good sign, and reviewed engorgement  Prevention and tx. Also reviewed supply and demand and the importance of consistent pumping at least 8 times a day and when necessary to prevent  Engorgement. Goal to establish and protect milk supply. Per mom will have a DEBP at home.  LC also encouraged [facility] if she decides to re- latch . Mom responded and said I only plan to pump and bottle feed.  Mother informed of post-discharge support and given phone number to the lactation department, including services for phone call assistance; out-patient appointments; and breastfeeding support group. List of other breastfeeding resources in the community given in the handout. Encouraged mother to call for problems or concerns related to breastfeeding.   Maternal Data    Feeding Feeding Type: Bottle Fed - Formula Nipple Type: Slow - flow  LATCH Score/Interventions                Intervention(s): Breastfeeding basics reviewed     Lactation Tools Discussed/Used     Consult Status Consult Status: Complete Date: 08/16/15    Kathrin Greathouseorio, Adin Lariccia Ann 08/16/2015, 10:36 AM

## 2015-08-16 NOTE — Progress Notes (Signed)
Patient is eating, ambulating, voiding.  Pain control is good.  Filed Vitals:   08/13/15 2315 08/14/15 0216 08/14/15 1756 08/15/15 0622  BP: 120/70 132/63 150/85 131/77  Pulse: 82 86 87 84  Temp: 98.8 F (37.1 C) 98.4 F (36.9 C) 98.6 F (37 C) 98.7 F (37.1 C)  TempSrc: Oral Oral Oral Oral  Resp: 20 20  18   Height:      Weight:      SpO2:    100%    Fundus firm Perineum without swelling.  Lab Results  Component Value Date   WBC 14.3* 08/14/2015   HGB 9.0* 08/14/2015   HCT 26.4* 08/14/2015   MCV 81.2 08/14/2015   PLT 196 08/14/2015    --/--/O POS (12/03 1155)/RI  A/P Post partum day 3.  Routine care.  Expect d/c routine.    Amirra Herling A

## 2016-08-01 ENCOUNTER — Other Ambulatory Visit: Payer: Self-pay | Admitting: Obstetrics and Gynecology

## 2016-08-01 DIAGNOSIS — N63 Unspecified lump in unspecified breast: Secondary | ICD-10-CM

## 2016-08-10 ENCOUNTER — Ambulatory Visit
Admission: RE | Admit: 2016-08-10 | Discharge: 2016-08-10 | Disposition: A | Discharge status: Home / Self Care | Payer: BC Managed Care – PPO | Source: Ambulatory Visit | Attending: Obstetrics and Gynecology | Admitting: Obstetrics and Gynecology

## 2016-08-10 DIAGNOSIS — N63 Unspecified lump in unspecified breast: Secondary | ICD-10-CM

## 2017-03-19 ENCOUNTER — Encounter: Payer: Self-pay | Admitting: Urgent Care

## 2017-03-19 ENCOUNTER — Ambulatory Visit (INDEPENDENT_AMBULATORY_CARE_PROVIDER_SITE_OTHER): Payer: BC Managed Care – PPO | Admitting: Urgent Care

## 2017-03-19 VITALS — BP 99/65 | HR 77 | Temp 99.1°F | Resp 17 | Ht 61.0 in | Wt 113.0 lb

## 2017-03-19 DIAGNOSIS — L301 Dyshidrosis [pompholyx]: Secondary | ICD-10-CM | POA: Diagnosis not present

## 2017-03-19 MED ORDER — BETAMETHASONE DIPROPIONATE 0.05 % EX CREA: TOPICAL_CREAM | Freq: Two times a day (BID) | CUTANEOUS | 0 refills | Status: DC

## 2017-03-19 NOTE — Patient Instructions (Addendum)
Hand Dermatitis Hand dermatitis is a skin condition that causes small, itchy, raised dots or fluid-filled blisters to form over the palms of the hands. This condition may also be called hand eczema. What are the causes? The cause of this condition is not known. What increases the risk? This condition is more likely to develop in people who have a history of allergies, such as:  Hay fever.  Allergic asthma.  An allergy to latex.  Chemical exposure, injuries, and environmental irritants can make hand dermatitis worse. Washing your hands too often can remove natural oils, which can dry out the skin and contribute to outbreaks of this condition. What are the signs or symptoms? The most common symptom of this condition is intense itchiness. Cracks or grooves (fissures) on the fingers can also develop. Affected areas can be painful, especially areas where large blisters have formed. How is this diagnosed? This condition is diagnosed with a medical history and physical exam. How is this treated? This condition is treated with medicines, including:  Steroid creams and ointments.  Oral steroid medicines.  Antibiotic medicines. These are prescribed if you have an infection.  Antihistamine medicines. These help to reduce itchiness.  Follow these instructions at home:  Take or apply over-the-counter and prescription medicines only as told by your health care provider.  If you were prescribed an antibiotic medicine, use it as told by your health care provider. Do not stop using the antibiotic even if you start to feel better.  Avoid washing your hands more often than necessary.  Avoid using harsh chemicals on your hands.  Wear protective gloves when you handle products that can irritate your skin.  Keep all follow-up visits as told by your health care provider. This is important. Contact a health care provider if:  Your rash does not improve during the first week of treatment.  Your  rash is red or tender.  Your rash has pus coming from it.  Your rash spreads. This information is not intended to replace advice given to you by your health care provider. Make sure you discuss any questions you have with your health care provider. Document Released: 08/27/2005 Document Revised: 02/02/2016 Document Reviewed: 03/11/2015 Elsevier Interactive Patient Education  2018 [employer]     IF you received an x-ray today, you will receive an invoice from [facility]. Please contact [facility] at [phone] with questions or concerns regarding your invoice.   IF you received labwork today, you will receive an invoice from [facility]. Please contact [facility] at [phone] with questions or concerns regarding your invoice.   Our billing staff will not be able to assist you with questions regarding bills from these companies.  You will be contacted with the lab results as soon as they are available. The fastest way to get your results is to activate your My Chart account. Instructions are located on the last page of this paperwork. If you have not heard from us regarding the results in 2 weeks, please contact this office.

## 2017-03-19 NOTE — Progress Notes (Signed)
  MRN: [id number] DOB: 03/14/1991  Subjective:   Sherry Pena is a 26 y.o. female presenting for chief complaint of hands broken out and not sure why  Reports 1 week history of rash over her hands. Rash is stingy and painful. Patient washes her hands frequently, does not dry them. 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 Denies fever, drainage of pus or bleeding. Has had this rash before, is currently using Cortizone 10 but is not having any relief.   Sherry Pena has a current medication list which includes the following prescription(s): hydrocortisone cream. Also has No Known Allergies.  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 Also  has a past surgical history that includes genital warts and Wisdom tooth extraction.  Objective:   Vitals: BP 99/65   Pulse 77   Temp 99.1 F (37.3 C) (Oral)   Resp 17   Ht 5\' 1"  (1.549 m)   Wt 113 lb (51.3 kg)   SpO2 98%   BMI 21.35 kg/m   Physical Exam  Constitutional: She is oriented to person, place, and time. She appears well-developed and well-nourished.  Cardiovascular: Normal rate.   Pulmonary/Chest: Effort normal.  Neurological: She is alert and oriented to person, place, and time.  Skin: Rash noted.       Assessment and Plan :   1. Dyshidrotic eczema - Start betamethasone cream twice daily for up to 2 weeks. Hand care was also reviewed. RTC thereafter if symptoms persist.  Wallis BambergMario Conal Shetley, PA-C [facility] Care at [facility] [phone] 03/19/2017  10:51 AM

## 2017-04-24 ENCOUNTER — Other Ambulatory Visit: Payer: Self-pay | Admitting: Urgent Care

## 2017-04-27 NOTE — Telephone Encounter (Signed)
Pt needs hand cream rx refill/// pts hand are cracking and bleeding please call if rx can not be filled  [facility] [address]  [phone]

## 2017-05-01 ENCOUNTER — Encounter: Payer: Self-pay | Admitting: Urgent Care

## 2017-05-01 ENCOUNTER — Ambulatory Visit (INDEPENDENT_AMBULATORY_CARE_PROVIDER_SITE_OTHER): Payer: BC Managed Care – PPO | Admitting: Urgent Care

## 2017-05-01 VITALS — BP 111/68 | HR 97 | Temp 99.1°F | Resp 17 | Ht 62.0 in | Wt 109.0 lb

## 2017-05-01 DIAGNOSIS — L301 Dyshidrosis [pompholyx]: Secondary | ICD-10-CM | POA: Diagnosis not present

## 2017-05-01 MED ORDER — BETAMETHASONE DIPROPIONATE 0.05 % EX CREA: TOPICAL_CREAM | CUTANEOUS | 0 refills | Status: DC

## 2017-05-01 NOTE — Patient Instructions (Addendum)
Please let me know if the steroid cream does not work or if your symptoms recur. We may need to pursue a referral to dermatology for a consult. Give yourself 2 weeks of a break in between 1 week use of the steroid cream. Thank you for letting me participate in your health and well being.     IF you received an x-ray today, you will receive an invoice from [facility]. Please contact [facility] at [phone] with questions or concerns regarding your invoice.   IF you received labwork today, you will receive an invoice from [place]. Please contact [facility] at [phone] with questions or concerns regarding your invoice.   Our billing staff will not be able to assist you with questions regarding bills from these companies.  You will be contacted with the lab results as soon as they are available. The fastest way to get your results is to activate your My Chart account. Instructions are located on the last page of this paperwork. If you have not heard from [place] regarding the results in 2 weeks, please contact this office.

## 2017-05-01 NOTE — Telephone Encounter (Signed)
Please call and schedule patient and appt within 1 month with PCP.  

## 2017-05-01 NOTE — Progress Notes (Signed)
    MRN: [id number] DOB: 12-Jun-1991  Subjective:   Sherry Pena is a 26 y.o. female presenting for chief complaint of hand dermatitis  Reports 1 week history of recurrent rash of her hands. Patient admits that she has not taken care to keep her hand from excessive moisture. She no longer has the steroid cream that we used previously for her dyshidrotic eczema with very good results. Denies fever, pain, drainage of pus or bleeding, coming into contact with poisonous plants.    Sherry Pena has a current medication list which includes the following prescription(s): betamethasone dipropionate and hydrocortisone cream. Also has No Known Allergies.  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 Also  has a past surgical history that includes genital warts and Wisdom tooth extraction.  Objective:   Vitals: BP 111/68   Pulse 97   Temp 99.1 F (37.3 C) (Oral)   Resp 17   Ht 5\' 2"  (1.575 m)   Wt 109 lb (49.4 kg)   LMP 04/22/2017 (Approximate)   SpO2 98%   Breastfeeding? No   BMI 19.94 kg/m   Physical Exam  Constitutional: She is oriented to person, place, and time. She appears well-developed and well-nourished.  Cardiovascular: Normal rate.   Pulmonary/Chest: Effort normal.  Neurological: She is alert and oriented to person, place, and time.  Skin: Rash (multiple blister like lesions over her hands bilaterally) noted.   Assessment and Plan :   1. Dyshidrotic eczema - Refilled betamethasone cream. Counseled patient on use of topical steroids for the skin and potential for adverse effects with medications prescribed today, patient verbalized understanding. If her symptoms persist, will pursue referral to dermatology. Patient is in agreement.  Wallis Bamberg, PA-C Primary Care at [facility] [phone] 05/01/2017  5:59 PM

## 2019-04-14 LAB — OB RESULTS CONSOLE ABO/RH: RH Type: POSITIVE

## 2019-04-14 LAB — OB RESULTS CONSOLE RPR: RPR: NONREACTIVE

## 2019-04-14 LAB — OB RESULTS CONSOLE HIV ANTIBODY (ROUTINE TESTING): HIV: NONREACTIVE

## 2019-04-14 LAB — OB RESULTS CONSOLE RUBELLA ANTIBODY, IGM: Rubella: IMMUNE

## 2019-04-14 LAB — OB RESULTS CONSOLE ANTIBODY SCREEN: Antibody Screen: NEGATIVE

## 2019-04-14 LAB — OB RESULTS CONSOLE GC/CHLAMYDIA
Chlamydia: NEGATIVE
Gonorrhea: NEGATIVE

## 2019-04-14 LAB — OB RESULTS CONSOLE HEPATITIS B SURFACE ANTIGEN: Hepatitis B Surface Ag: NEGATIVE

## 2019-09-29 ENCOUNTER — Other Ambulatory Visit (HOSPITAL_COMMUNITY): Payer: Self-pay | Admitting: Obstetrics and Gynecology

## 2019-09-29 DIAGNOSIS — Z363 Encounter for antenatal screening for malformations: Secondary | ICD-10-CM

## 2019-09-29 DIAGNOSIS — Z3A30 30 weeks gestation of pregnancy: Secondary | ICD-10-CM

## 2019-09-29 DIAGNOSIS — O30043 Twin pregnancy, dichorionic/diamniotic, third trimester: Secondary | ICD-10-CM

## 2019-09-29 DIAGNOSIS — O365931 Maternal care for other known or suspected poor fetal growth, third trimester, fetus 1: Secondary | ICD-10-CM

## 2019-10-05 ENCOUNTER — Encounter (HOSPITAL_COMMUNITY): Payer: Self-pay | Admitting: *Deleted

## 2019-10-09 ENCOUNTER — Ambulatory Visit (HOSPITAL_COMMUNITY)
Admission: RE | Admit: 2019-10-09 | Discharge: 2019-10-09 | Disposition: A | Discharge status: Home / Self Care | Payer: BC Managed Care – PPO | Source: Ambulatory Visit | Attending: Obstetrics and Gynecology | Admitting: Obstetrics and Gynecology

## 2019-10-09 ENCOUNTER — Other Ambulatory Visit: Payer: Self-pay

## 2019-10-09 ENCOUNTER — Ambulatory Visit (HOSPITAL_COMMUNITY): Payer: BC Managed Care – PPO | Admitting: *Deleted

## 2019-10-09 ENCOUNTER — Ambulatory Visit (HOSPITAL_COMMUNITY): Payer: BC Managed Care – PPO

## 2019-10-09 ENCOUNTER — Other Ambulatory Visit (HOSPITAL_COMMUNITY): Payer: Self-pay | Admitting: *Deleted

## 2019-10-09 ENCOUNTER — Encounter (HOSPITAL_COMMUNITY): Payer: Self-pay

## 2019-10-09 VITALS — BP 121/73 | HR 101 | Temp 98.3°F | Wt 152.4 lb

## 2019-10-09 DIAGNOSIS — O30043 Twin pregnancy, dichorionic/diamniotic, third trimester: Secondary | ICD-10-CM | POA: Diagnosis not present

## 2019-10-09 DIAGNOSIS — O365931 Maternal care for other known or suspected poor fetal growth, third trimester, fetus 1: Secondary | ICD-10-CM | POA: Diagnosis not present

## 2019-10-09 DIAGNOSIS — O0990 Supervision of high risk pregnancy, unspecified, unspecified trimester: Secondary | ICD-10-CM

## 2019-10-09 DIAGNOSIS — Z363 Encounter for antenatal screening for malformations: Secondary | ICD-10-CM | POA: Diagnosis not present

## 2019-10-09 DIAGNOSIS — O30049 Twin pregnancy, dichorionic/diamniotic, unspecified trimester: Secondary | ICD-10-CM

## 2019-10-09 DIAGNOSIS — Z3A30 30 weeks gestation of pregnancy: Secondary | ICD-10-CM | POA: Insufficient documentation

## 2019-10-09 NOTE — Consult Note (Signed)
MFM Note  This patient was seen due to possible fetal growth restriction noted in twin A on a recent ultrasound performed in your office.  This is a spontaneously conceived dichorionic, diamniotic twin gestation.  The patient reports that she has been followed with serial ultrasounds in your office.  She denies any significant past medical history and denies any other problems in her current pregnancy.  She reports that she has screened negative for gestational diabetes.  She also reports that she had a normal fetal anatomy scan performed in your office.  She had a cell free DNA test that indicated a low risk for trisomy 21, 18, and 13.  A female and female fetuses are predicted.  She was informed that the overall EFW obtained for twin A measured at the 12th percentile for her gestational age.  The overall EFW obtained for twin B measured at the 19th percentile for her gestational age.  There was normal amniotic fluid noted around both fetuses.  The views of the fetal anatomy were limited today due to her advanced gestational age.  Doppler studies of the umbilical arteries performed today for both twin A and twin B showed a normal S/D ratio.  There were no signs of absent or reversed end-diastolic flow noted in either fetus.  The patient was advised that due to the smaller size noted in twin A, she should start weekly fetal testing in your office next week and continue these tests until delivery.  A follow-up growth scan was scheduled in our office in 3 weeks.  The patient understands that should the EFW for either fetus measure at less than the 10th percentile during her future ultrasound exams, that a delivery before 38 weeks will most likely be recommended.  She was advised to continue to monitor fetal movements daily.  The patient stated that all of her questions had been answered to her complete satisfaction.  Thank you for referring this very nice patient for Maternal-Fetal Medicine  consultation.  Recommendations: Start weekly fetal testing in your office next week.   A follow-up growth scan was scheduled in our office in 3 weeks.

## 2019-10-30 ENCOUNTER — Encounter (HOSPITAL_COMMUNITY): Payer: Self-pay

## 2019-10-30 ENCOUNTER — Ambulatory Visit (HOSPITAL_COMMUNITY): Payer: BC Managed Care – PPO | Admitting: *Deleted

## 2019-10-30 ENCOUNTER — Other Ambulatory Visit (HOSPITAL_COMMUNITY): Payer: Self-pay | Admitting: Obstetrics

## 2019-10-30 ENCOUNTER — Ambulatory Visit (HOSPITAL_COMMUNITY)
Admission: RE | Admit: 2019-10-30 | Discharge: 2019-10-30 | Disposition: A | Discharge status: Home / Self Care | Payer: BC Managed Care – PPO | Source: Ambulatory Visit | Attending: Obstetrics and Gynecology | Admitting: Obstetrics and Gynecology

## 2019-10-30 ENCOUNTER — Other Ambulatory Visit: Payer: Self-pay

## 2019-10-30 ENCOUNTER — Other Ambulatory Visit (HOSPITAL_COMMUNITY): Payer: Self-pay | Admitting: *Deleted

## 2019-10-30 VITALS — BP 129/76 | HR 97 | Temp 98.0°F

## 2019-10-30 DIAGNOSIS — O30049 Twin pregnancy, dichorionic/diamniotic, unspecified trimester: Secondary | ICD-10-CM

## 2019-10-30 DIAGNOSIS — O30043 Twin pregnancy, dichorionic/diamniotic, third trimester: Secondary | ICD-10-CM | POA: Diagnosis not present

## 2019-10-30 DIAGNOSIS — Z362 Encounter for other antenatal screening follow-up: Secondary | ICD-10-CM

## 2019-10-30 DIAGNOSIS — Z3A33 33 weeks gestation of pregnancy: Secondary | ICD-10-CM | POA: Diagnosis not present

## 2019-11-06 ENCOUNTER — Ambulatory Visit (HOSPITAL_COMMUNITY)
Admission: RE | Admit: 2019-11-06 | Discharge: 2019-11-06 | Disposition: A | Discharge status: Home / Self Care | Payer: BC Managed Care – PPO | Source: Ambulatory Visit | Attending: Obstetrics and Gynecology | Admitting: Obstetrics and Gynecology

## 2019-11-06 ENCOUNTER — Ambulatory Visit (HOSPITAL_COMMUNITY): Payer: BC Managed Care – PPO | Admitting: *Deleted

## 2019-11-06 ENCOUNTER — Other Ambulatory Visit (HOSPITAL_COMMUNITY): Payer: Self-pay | Admitting: Obstetrics and Gynecology

## 2019-11-06 ENCOUNTER — Encounter (HOSPITAL_COMMUNITY): Payer: Self-pay

## 2019-11-06 ENCOUNTER — Other Ambulatory Visit: Payer: Self-pay

## 2019-11-06 VITALS — BP 132/71 | HR 100 | Temp 97.8°F

## 2019-11-06 DIAGNOSIS — Z3A34 34 weeks gestation of pregnancy: Secondary | ICD-10-CM | POA: Diagnosis not present

## 2019-11-06 DIAGNOSIS — O30049 Twin pregnancy, dichorionic/diamniotic, unspecified trimester: Secondary | ICD-10-CM | POA: Insufficient documentation

## 2019-11-06 DIAGNOSIS — O365931 Maternal care for other known or suspected poor fetal growth, third trimester, fetus 1: Secondary | ICD-10-CM | POA: Diagnosis not present

## 2019-11-06 DIAGNOSIS — O30043 Twin pregnancy, dichorionic/diamniotic, third trimester: Secondary | ICD-10-CM

## 2019-11-09 ENCOUNTER — Other Ambulatory Visit: Payer: Self-pay | Admitting: Obstetrics and Gynecology

## 2019-11-10 ENCOUNTER — Other Ambulatory Visit (HOSPITAL_COMMUNITY): Payer: Self-pay | Admitting: *Deleted

## 2019-11-13 ENCOUNTER — Other Ambulatory Visit: Payer: Self-pay

## 2019-11-13 ENCOUNTER — Ambulatory Visit (HOSPITAL_COMMUNITY): Payer: BC Managed Care – PPO | Admitting: *Deleted

## 2019-11-13 ENCOUNTER — Other Ambulatory Visit (HOSPITAL_COMMUNITY): Payer: Self-pay | Admitting: Obstetrics and Gynecology

## 2019-11-13 ENCOUNTER — Encounter (HOSPITAL_COMMUNITY): Payer: Self-pay

## 2019-11-13 ENCOUNTER — Ambulatory Visit (HOSPITAL_COMMUNITY)
Admission: RE | Admit: 2019-11-13 | Discharge: 2019-11-13 | Disposition: A | Discharge status: Home / Self Care | Payer: BC Managed Care – PPO | Source: Ambulatory Visit | Attending: Obstetrics and Gynecology | Admitting: Obstetrics and Gynecology

## 2019-11-13 VITALS — BP 122/76 | HR 88 | Temp 98.2°F

## 2019-11-13 DIAGNOSIS — O30043 Twin pregnancy, dichorionic/diamniotic, third trimester: Secondary | ICD-10-CM

## 2019-11-13 DIAGNOSIS — O30049 Twin pregnancy, dichorionic/diamniotic, unspecified trimester: Secondary | ICD-10-CM | POA: Diagnosis present

## 2019-11-13 DIAGNOSIS — Z3A32 32 weeks gestation of pregnancy: Secondary | ICD-10-CM

## 2019-11-14 ENCOUNTER — Encounter (HOSPITAL_COMMUNITY): Payer: Self-pay | Admitting: Obstetrics and Gynecology

## 2019-11-14 ENCOUNTER — Inpatient Hospital Stay (HOSPITAL_COMMUNITY)
Admission: AD | Admit: 2019-11-14 | Discharge: 2019-11-15 | Disposition: A | Discharge status: Home / Self Care | Payer: BC Managed Care – PPO | Attending: Obstetrics and Gynecology | Admitting: Obstetrics and Gynecology

## 2019-11-14 ENCOUNTER — Other Ambulatory Visit: Payer: Self-pay

## 2019-11-14 DIAGNOSIS — O30043 Twin pregnancy, dichorionic/diamniotic, third trimester: Secondary | ICD-10-CM | POA: Diagnosis not present

## 2019-11-14 DIAGNOSIS — O133 Gestational [pregnancy-induced] hypertension without significant proteinuria, third trimester: Secondary | ICD-10-CM | POA: Diagnosis not present

## 2019-11-14 DIAGNOSIS — O4703 False labor before 37 completed weeks of gestation, third trimester: Secondary | ICD-10-CM | POA: Insufficient documentation

## 2019-11-14 DIAGNOSIS — Z3A35 35 weeks gestation of pregnancy: Secondary | ICD-10-CM | POA: Insufficient documentation

## 2019-11-14 LAB — CBC
HCT: 30 % — ABNORMAL LOW (ref 36.0–46.0)
Hemoglobin: 9.9 g/dL — ABNORMAL LOW (ref 12.0–15.0)
MCH: 25.5 pg — ABNORMAL LOW (ref 26.0–34.0)
MCHC: 33 g/dL (ref 30.0–36.0)
MCV: 77.3 fL — ABNORMAL LOW (ref 80.0–100.0)
Platelets: 232 10*3/uL (ref 150–400)
RBC: 3.88 MIL/uL (ref 3.87–5.11)
RDW: 16 % — ABNORMAL HIGH (ref 11.5–15.5)
WBC: 10.7 10*3/uL — ABNORMAL HIGH (ref 4.0–10.5)
nRBC: 0.3 % — ABNORMAL HIGH (ref 0.0–0.2)

## 2019-11-14 LAB — PROTEIN / CREATININE RATIO, URINE
Creatinine, Urine: 31.62 mg/dL
Total Protein, Urine: <6 mg/dL

## 2019-11-14 LAB — COMPREHENSIVE METABOLIC PANEL
ALT: 17 U/L (ref 0–44)
AST: 27 U/L (ref 15–41)
Albumin: 2.6 g/dL — ABNORMAL LOW (ref 3.5–5.0)
Alkaline Phosphatase: 194 U/L — ABNORMAL HIGH (ref 38–126)
Anion gap: 8 (ref 5–15)
BUN: <5 mg/dL — ABNORMAL LOW (ref 6–20)
CO2: 22 mmol/L (ref 22–32)
Calcium: 8.8 mg/dL — ABNORMAL LOW (ref 8.9–10.3)
Chloride: 108 mmol/L (ref 98–111)
Creatinine, Ser: 0.72 mg/dL (ref 0.44–1.00)
GFR calc Af Amer: >60 mL/min (ref >60)
GFR calc non Af Amer: >60 mL/min (ref >60)
Glucose, Bld: 86 mg/dL (ref 70–99)
Potassium: 4 mmol/L (ref 3.5–5.1)
Sodium: 138 mmol/L (ref 135–145)
Total Bilirubin: 0.6 mg/dL (ref 0.3–1.2)
Total Protein: 5.6 g/dL — ABNORMAL LOW (ref 6.5–8.1)

## 2019-11-14 LAB — URINALYSIS, ROUTINE W REFLEX MICROSCOPIC
Bilirubin Urine: NEGATIVE
Glucose, UA: NEGATIVE mg/dL
Hgb urine dipstick: NEGATIVE
Ketones, ur: NEGATIVE mg/dL
Leukocytes,Ua: NEGATIVE
Nitrite: NEGATIVE
Protein, ur: NEGATIVE mg/dL
Specific Gravity, Urine: 1.002 — ABNORMAL LOW (ref 1.005–1.030)
pH: 7 (ref 5.0–8.0)

## 2019-11-14 LAB — URIC ACID: Uric Acid, Serum: 5.4 mg/dL (ref 2.5–7.1)

## 2019-11-14 NOTE — MAU Provider Note (Signed)
History     Chief Complaint  Patient presents with   Contractions   29 yo G [id number] @ 35 1/7 wk twin gestation presents with complaint of contractions. Pt also c/o leg swelling. Denies vaginal bleeding or decreased FM. PNC complicated by twin A( IUGR) followed by MFM. Pt denies h/a, visual changes or epigastric pain  OB History     Gravida  4   Para  1   Term  1   Preterm      AB  2   Living  1      SAB  2   TAB      Ectopic      Multiple  0   Live Births  1           Past Medical History:  Diagnosis Date   History of genital warts    HPV (human papilloma virus) infection     Past Surgical History:  Procedure Laterality Date   genital warts     Laser removal   WISDOM TOOTH EXTRACTION      No family history on file.  Social History   Tobacco Use   Smoking status: Former Smoker    Packs/day: 0.30    Years: 10.00    Pack years: 3.00    Types: Cigarettes   Smokeless tobacco: Never Used  Substance Use Topics   Alcohol use: Not Currently    Alcohol/week: 0.0 standard drinks   Drug use: No    Allergies: No Known Allergies  Medications Prior to Admission  Medication Sig Dispense Refill Last Dose   buPROPion HCl (WELLBUTRIN PO) Take by mouth.   Past Week at Unknown time   Ferrous Sulfate (IRON PO) Take by mouth.   Past Week at Unknown time   Prenatal Vit-Fe Fumarate-FA (PRENATAL VITAMIN PO) Take by mouth.   11/13/2019 at Unknown time   betamethasone dipropionate (DIPROLENE) 0.05 % cream APPLY EXTERNALLY TO THE AFFECTED AREA TWICE DAILY (Patient not taking: Reported on 10/09/2019) 30 g 0      Physical Exam   Blood pressure (!) 132/93, pulse 79, temperature 98.9 F (37.2 C), temperature source Oral, resp. rate 18, weight 75.9 kg, last menstrual period 03/13/2019, SpO2 100 %.  General appearance: alert, cooperative, and no distress Abdomen:  gravid soft Pelvic:  1/60/-1 posterior Extremities: edema 1+  Tracing: A. Baseline 130 (+) accels to  150-155 B) baseline 120-125 (+) accel to 160  Irreg ctx  ED Course  IMP: gestational HTN  Twin gestation@ 35 1/7 wk Davie P) PIH labs. NST MDM   Marvene Staff, MD 10:00 PM 11/14/2019  Addendum Patient Vitals for the past 24 hrs:  BP Temp Temp src Pulse Resp SpO2 Weight  11/15/19 0001 138/82 -- -- 74 -- -- --  11/14/19 2345 (!) 142/102 -- -- 83 -- 100 % --  11/14/19 2331 (!) 124/96 -- -- 84 -- -- --  11/14/19 2315 135/84 -- -- 76 -- 100 % --  11/14/19 2301 (!) 141/86 -- -- 78 -- -- --  11/14/19 2245 (!) 137/92 -- -- 77 -- 100 % --  11/14/19 2230 (!) 135/92 -- -- 79 -- 100 % --  11/14/19 2216 (!) 145/97 -- -- 79 -- -- --  11/14/19 2200 125/87 -- -- 78 -- 100 % --  11/14/19 2145 (!) 132/93 -- -- 79 -- 100 % --  11/14/19 2130 (!) 144/85 -- -- 75 -- 100 % --  11/14/19 2119 (!) 142/86 -- --  78 -- -- --  11/14/19 2102 (!) 150/98 -- -- 77 -- -- --  11/14/19 2050 138/83 98.9 F (37.2 C) Oral 92 18 -- 75.9 kg   CMP Latest Ref Rng & Units 11/14/2019 11/27/2011  Glucose 70 - 99 mg/dL 86 432(W)  BUN 6 - 20 mg/dL <0(V) 5(L)  Creatinine 0.44 - 1.00 mg/dL 7.94 4.46  Sodium 190 - 145 mmol/L 138 142  Potassium 3.5 - 5.1 mmol/L 4.0 3.6  Chloride 98 - 111 mmol/L 108 109  CO2 22 - 32 mmol/L 22 24  Calcium 8.9 - 10.3 mg/dL 1.2(Q) 8.7  Total Protein 6.5 - 8.1 g/dL 2.4(V) 6.7  Total Bilirubin 0.3 - 1.2 mg/dL 0.6 0.4  Alkaline Phos 38 - 126 U/L 194(H) 63  AST 15 - 41 U/L 27 17  ALT 0 - 44 U/L 17 11   CBC Latest Ref Rng & Units 11/14/2019 08/14/2015 08/13/2015  WBC 4.0 - 10.5 K/uL 10.7(H) 14.3(H) 12.2(H)  Hemoglobin 12.0 - 15.0 g/dL 1.4(Y) 4.3(X) 11.2(L)  Hematocrit 36.0 - 46.0 % 30.0(L) 26.4(L) 32.9(L)  Platelets 150 - 400 K/uL 232 196 246   Urine protein/creatinine not measurable IMP' gestational HTN w/o preeclampsia Twins( di-di)  IUGR twin A P) d/c home labor prec. Keep sched OB appts. Preeclampsia warning signs

## 2019-11-14 NOTE — MAU Note (Signed)
Patient reports to MAU c/o ctx every 5 min. Pt denies bleeding or LOF. +FM.

## 2019-11-15 NOTE — Discharge Instructions (Signed)

## 2019-11-18 ENCOUNTER — Telehealth (HOSPITAL_COMMUNITY): Payer: Self-pay | Admitting: *Deleted

## 2019-11-18 ENCOUNTER — Encounter (HOSPITAL_COMMUNITY): Payer: Self-pay

## 2019-11-18 NOTE — Telephone Encounter (Signed)
Preadmission screen  

## 2019-11-18 NOTE — Patient Instructions (Signed)
Sherry Pena  11/18/2019   Your procedure is scheduled on:  11/27/2019  Arrive at 1130 at [facility] on [address] at [facility]  and [employer]. You are invited to use the FREE valet parking or use the Visitor's parking deck.  Pick up the phone at the desk and dial [phone].  Call this number if you have problems the morning of surgery: [phone]  Remember:   Do not eat food:(After Midnight) Desps de medianoche.  Do not drink clear liquids: (After Midnight) Desps de medianoche.  Take these medicines the morning of surgery with A SIP OF WATER:  none   Do not wear jewelry, make-up or nail polish.  Do not wear lotions, powders, or perfumes. Do not wear deodorant.  Do not shave 48 hours prior to surgery.  Do not bring valuables to the hospital.  [facility] is not   responsible for any belongings or valuables brought to the hospital.  Contacts, dentures or bridgework may not be worn into surgery.  Leave suitcase in the car. After surgery it may be brought to your room.  For patients admitted to the hospital, checkout time is 11:00 AM the day of              discharge.      Please read over the following fact sheets that you were given:     Preparing for Surgery

## 2019-11-19 ENCOUNTER — Encounter (HOSPITAL_COMMUNITY): Payer: Self-pay

## 2019-11-20 ENCOUNTER — Encounter (HOSPITAL_COMMUNITY): Payer: Self-pay

## 2019-11-20 ENCOUNTER — Other Ambulatory Visit: Payer: Self-pay

## 2019-11-20 ENCOUNTER — Ambulatory Visit (HOSPITAL_BASED_OUTPATIENT_CLINIC_OR_DEPARTMENT_OTHER)
Admission: RE | Admit: 2019-11-20 | Discharge: 2019-11-20 | Disposition: A | Discharge status: Home / Self Care | Payer: BC Managed Care – PPO | Source: Ambulatory Visit | Attending: Obstetrics and Gynecology | Admitting: Obstetrics and Gynecology

## 2019-11-20 ENCOUNTER — Other Ambulatory Visit (HOSPITAL_COMMUNITY): Payer: Self-pay | Admitting: Obstetrics and Gynecology

## 2019-11-20 ENCOUNTER — Ambulatory Visit (HOSPITAL_COMMUNITY): Payer: BC Managed Care – PPO | Admitting: *Deleted

## 2019-11-20 VITALS — BP 139/91 | HR 87 | Temp 98.6°F

## 2019-11-20 DIAGNOSIS — Z3A36 36 weeks gestation of pregnancy: Secondary | ICD-10-CM

## 2019-11-20 DIAGNOSIS — O30049 Twin pregnancy, dichorionic/diamniotic, unspecified trimester: Secondary | ICD-10-CM

## 2019-11-20 DIAGNOSIS — O365932 Maternal care for other known or suspected poor fetal growth, third trimester, fetus 2: Secondary | ICD-10-CM

## 2019-11-20 DIAGNOSIS — O43893 Other placental disorders, third trimester: Secondary | ICD-10-CM

## 2019-11-20 DIAGNOSIS — O30043 Twin pregnancy, dichorionic/diamniotic, third trimester: Secondary | ICD-10-CM

## 2019-11-20 DIAGNOSIS — Z362 Encounter for other antenatal screening follow-up: Secondary | ICD-10-CM | POA: Diagnosis not present

## 2019-11-20 DIAGNOSIS — O133 Gestational [pregnancy-induced] hypertension without significant proteinuria, third trimester: Secondary | ICD-10-CM | POA: Diagnosis not present

## 2019-11-20 DIAGNOSIS — O365931 Maternal care for other known or suspected poor fetal growth, third trimester, fetus 1: Secondary | ICD-10-CM

## 2019-11-21 ENCOUNTER — Inpatient Hospital Stay (HOSPITAL_COMMUNITY): Payer: BC Managed Care – PPO | Admitting: Anesthesiology

## 2019-11-21 ENCOUNTER — Encounter (HOSPITAL_COMMUNITY): Payer: Self-pay | Admitting: Obstetrics and Gynecology

## 2019-11-21 ENCOUNTER — Other Ambulatory Visit: Payer: Self-pay

## 2019-11-21 ENCOUNTER — Inpatient Hospital Stay (HOSPITAL_COMMUNITY)
Admission: RE | Admit: 2019-11-21 | Discharge: 2019-11-24 | DRG: 787 | Disposition: A | Discharge status: Home / Self Care | Payer: BC Managed Care – PPO | Attending: Obstetrics and Gynecology | Admitting: Obstetrics and Gynecology

## 2019-11-21 ENCOUNTER — Encounter (HOSPITAL_COMMUNITY)
Admission: RE | Disposition: A | Discharge status: Home / Self Care | Payer: Self-pay | Source: Home / Self Care | Attending: Obstetrics and Gynecology

## 2019-11-21 DIAGNOSIS — O9902 Anemia complicating childbirth: Secondary | ICD-10-CM | POA: Diagnosis present

## 2019-11-21 DIAGNOSIS — Z87891 Personal history of nicotine dependence: Secondary | ICD-10-CM

## 2019-11-21 DIAGNOSIS — D573 Sickle-cell trait: Secondary | ICD-10-CM | POA: Diagnosis present

## 2019-11-21 DIAGNOSIS — O134 Gestational [pregnancy-induced] hypertension without significant proteinuria, complicating childbirth: Secondary | ICD-10-CM | POA: Diagnosis present

## 2019-11-21 DIAGNOSIS — D62 Acute posthemorrhagic anemia: Secondary | ICD-10-CM | POA: Diagnosis not present

## 2019-11-21 DIAGNOSIS — Z20822 Contact with and (suspected) exposure to covid-19: Secondary | ICD-10-CM | POA: Diagnosis present

## 2019-11-21 DIAGNOSIS — O365930 Maternal care for other known or suspected poor fetal growth, third trimester, not applicable or unspecified: Secondary | ICD-10-CM | POA: Diagnosis present

## 2019-11-21 DIAGNOSIS — O9081 Anemia of the puerperium: Secondary | ICD-10-CM | POA: Diagnosis not present

## 2019-11-21 DIAGNOSIS — O30043 Twin pregnancy, dichorionic/diamniotic, third trimester: Secondary | ICD-10-CM | POA: Diagnosis present

## 2019-11-21 DIAGNOSIS — O139 Gestational [pregnancy-induced] hypertension without significant proteinuria, unspecified trimester: Secondary | ICD-10-CM | POA: Diagnosis present

## 2019-11-21 DIAGNOSIS — O365932 Maternal care for other known or suspected poor fetal growth, third trimester, fetus 2: Secondary | ICD-10-CM | POA: Diagnosis present

## 2019-11-21 DIAGNOSIS — O365931 Maternal care for other known or suspected poor fetal growth, third trimester, fetus 1: Principal | ICD-10-CM | POA: Diagnosis present

## 2019-11-21 DIAGNOSIS — Z3A36 36 weeks gestation of pregnancy: Secondary | ICD-10-CM

## 2019-11-21 LAB — CBC
HCT: 32.4 % — ABNORMAL LOW (ref 36.0–46.0)
Hemoglobin: 10.4 g/dL — ABNORMAL LOW (ref 12.0–15.0)
MCH: 25.4 pg — ABNORMAL LOW (ref 26.0–34.0)
MCHC: 32.1 g/dL (ref 30.0–36.0)
MCV: 79 fL — ABNORMAL LOW (ref 80.0–100.0)
Platelets: 235 10*3/uL (ref 150–400)
RBC: 4.1 MIL/uL (ref 3.87–5.11)
RDW: 16.8 % — ABNORMAL HIGH (ref 11.5–15.5)
WBC: 10.1 10*3/uL (ref 4.0–10.5)
nRBC: 0 % (ref 0.0–0.2)

## 2019-11-21 LAB — TYPE AND SCREEN
ABO/RH(D): O POS
Antibody Screen: NEGATIVE

## 2019-11-21 LAB — COMPREHENSIVE METABOLIC PANEL
ALT: 16 U/L (ref 0–44)
AST: 28 U/L (ref 15–41)
Albumin: 2.9 g/dL — ABNORMAL LOW (ref 3.5–5.0)
Alkaline Phosphatase: 208 U/L — ABNORMAL HIGH (ref 38–126)
Anion gap: 13 (ref 5–15)
BUN: 5 mg/dL — ABNORMAL LOW (ref 6–20)
CO2: 22 mmol/L (ref 22–32)
Calcium: 8.6 mg/dL — ABNORMAL LOW (ref 8.9–10.3)
Chloride: 104 mmol/L (ref 98–111)
Creatinine, Ser: 0.91 mg/dL (ref 0.44–1.00)
GFR calc Af Amer: >60 mL/min (ref >60)
GFR calc non Af Amer: >60 mL/min (ref >60)
Glucose, Bld: 71 mg/dL (ref 70–99)
Potassium: 4.3 mmol/L (ref 3.5–5.1)
Sodium: 139 mmol/L (ref 135–145)
Total Bilirubin: 0.7 mg/dL (ref 0.3–1.2)
Total Protein: 6 g/dL — ABNORMAL LOW (ref 6.5–8.1)

## 2019-11-21 LAB — ABO/RH: ABO/RH(D): O POS

## 2019-11-21 LAB — URIC ACID: Uric Acid, Serum: 6.3 mg/dL (ref 2.5–7.1)

## 2019-11-21 LAB — RESPIRATORY PANEL BY RT PCR (FLU A&B, COVID)
Influenza A by PCR: NEGATIVE
Influenza B by PCR: NEGATIVE
SARS Coronavirus 2 by RT PCR: NEGATIVE

## 2019-11-21 LAB — PROTEIN / CREATININE RATIO, URINE
Creatinine, Urine: 74.42 mg/dL
Total Protein, Urine: <6 mg/dL

## 2019-11-21 SURGERY — Surgical Case
Anesthesia: Spinal

## 2019-11-21 MED ORDER — METOCLOPRAMIDE HCL 5 MG/ML IJ SOLN
INTRAMUSCULAR | Status: AC
  Filled 2019-11-21: qty 2

## 2019-11-21 MED ORDER — LABETALOL HCL 5 MG/ML IV SOLN
INTRAVENOUS | Status: AC
  Filled 2019-11-21: qty 4

## 2019-11-21 MED ORDER — FENTANYL CITRATE (PF) 100 MCG/2ML IJ SOLN
INTRAMUSCULAR | Status: DC | PRN
  Administered 2019-11-21: 85 ug via INTRAVENOUS
  Administered 2019-11-21: 15 ug via INTRAVENOUS

## 2019-11-21 MED ORDER — OXYCODONE HCL 5 MG PO TABS: 5.0000 mg | ORAL_TABLET | Freq: Once | ORAL | Status: DC | PRN

## 2019-11-21 MED ORDER — OXYCODONE HCL 5 MG/5ML PO SOLN: 5.0000 mg | Freq: Once | ORAL | Status: DC | PRN

## 2019-11-21 MED ORDER — FAMOTIDINE 20 MG PO TABS
20.0000 mg | ORAL_TABLET | Freq: Once | ORAL | Status: AC
  Administered 2019-11-21: 20 mg via ORAL

## 2019-11-21 MED ORDER — SODIUM CHLORIDE 0.9 % IR SOLN
Status: DC | PRN
  Administered 2019-11-21: 1

## 2019-11-21 MED ORDER — PHENYLEPHRINE HCL-NACL 20-0.9 MG/250ML-% IV SOLN
INTRAVENOUS | Status: DC | PRN
  Administered 2019-11-21: 60 ug/min via INTRAVENOUS

## 2019-11-21 MED ORDER — PHENYLEPHRINE HCL-NACL 20-0.9 MG/250ML-% IV SOLN
INTRAVENOUS | Status: AC
  Filled 2019-11-21: qty 250

## 2019-11-21 MED ORDER — DEXAMETHASONE SODIUM PHOSPHATE 10 MG/ML IJ SOLN
INTRAMUSCULAR | Status: DC | PRN
  Administered 2019-11-21: 10 mg via INTRAVENOUS

## 2019-11-21 MED ORDER — CEFAZOLIN SODIUM-DEXTROSE 2-4 GM/100ML-% IV SOLN: 2.0000 g | INTRAVENOUS | Status: DC

## 2019-11-21 MED ORDER — SCOPOLAMINE 1 MG/3DAYS TD PT72
MEDICATED_PATCH | TRANSDERMAL | Status: DC | PRN
  Administered 2019-11-21: 1 via TRANSDERMAL

## 2019-11-21 MED ORDER — FAMOTIDINE 20 MG PO TABS
ORAL_TABLET | ORAL | Status: AC
  Filled 2019-11-21: qty 1

## 2019-11-21 MED ORDER — OXYTOCIN 40 UNITS IN NORMAL SALINE INFUSION - SIMPLE MED: 2.5000 [IU]/h | INTRAVENOUS | Status: AC

## 2019-11-21 MED ORDER — WITCH HAZEL-GLYCERIN EX PADS: 1.0000 "application " | MEDICATED_PAD | CUTANEOUS | Status: DC | PRN

## 2019-11-21 MED ORDER — LACTATED RINGERS IV SOLN: INTRAVENOUS | Status: DC | PRN

## 2019-11-21 MED ORDER — SIMETHICONE 80 MG PO CHEW
80.0000 mg | CHEWABLE_TABLET | ORAL | Status: DC
  Administered 2019-11-22 – 2019-11-23 (×3): 80 mg via ORAL
  Filled 2019-11-21 (×3): qty 1

## 2019-11-21 MED ORDER — FENTANYL CITRATE (PF) 100 MCG/2ML IJ SOLN
INTRAMUSCULAR | Status: AC
  Filled 2019-11-21: qty 2

## 2019-11-21 MED ORDER — MORPHINE SULFATE (PF) 0.5 MG/ML IJ SOLN
INTRAMUSCULAR | Status: AC
  Filled 2019-11-21: qty 10

## 2019-11-21 MED ORDER — KETOROLAC TROMETHAMINE 30 MG/ML IJ SOLN
30.0000 mg | Freq: Four times a day (QID) | INTRAMUSCULAR | Status: AC
  Administered 2019-11-22 (×3): 30 mg via INTRAVENOUS
  Filled 2019-11-21 (×3): qty 1

## 2019-11-21 MED ORDER — KETOROLAC TROMETHAMINE 30 MG/ML IJ SOLN
30.0000 mg | Freq: Once | INTRAMUSCULAR | Status: AC | PRN
  Administered 2019-11-21: 30 mg via INTRAVENOUS

## 2019-11-21 MED ORDER — MORPHINE SULFATE (PF) 0.5 MG/ML IJ SOLN
INTRAMUSCULAR | Status: DC | PRN
  Administered 2019-11-21: .15 mg via EPIDURAL
  Administered 2019-11-21: 2.85 mg via EPIDURAL

## 2019-11-21 MED ORDER — OXYTOCIN 10 UNIT/ML IJ SOLN
INTRAMUSCULAR | Status: AC
  Filled 2019-11-21: qty 1

## 2019-11-21 MED ORDER — OXYTOCIN 40 UNITS IN NORMAL SALINE INFUSION - SIMPLE MED
INTRAVENOUS | Status: DC | PRN
  Administered 2019-11-21: 40 [IU] via INTRAVENOUS

## 2019-11-21 MED ORDER — DIPHENHYDRAMINE HCL 25 MG PO CAPS
25.0000 mg | ORAL_CAPSULE | Freq: Four times a day (QID) | ORAL | Status: DC | PRN
  Administered 2019-11-21: 25 mg via ORAL
  Filled 2019-11-21: qty 1

## 2019-11-21 MED ORDER — LABETALOL HCL 5 MG/ML IV SOLN
5.0000 mg | Freq: Once | INTRAVENOUS | Status: AC
  Administered 2019-11-21: 5 mg via INTRAVENOUS

## 2019-11-21 MED ORDER — CEFAZOLIN SODIUM-DEXTROSE 2-3 GM-%(50ML) IV SOLR
INTRAVENOUS | Status: DC | PRN
  Administered 2019-11-21: 2 g via INTRAVENOUS

## 2019-11-21 MED ORDER — ZOLPIDEM TARTRATE 5 MG PO TABS: 5.0000 mg | ORAL_TABLET | Freq: Every evening | ORAL | Status: DC | PRN

## 2019-11-21 MED ORDER — HYDROMORPHONE HCL 1 MG/ML IJ SOLN: 0.2500 mg | INTRAMUSCULAR | Status: DC | PRN

## 2019-11-21 MED ORDER — MENTHOL 3 MG MT LOZG: 1.0000 | LOZENGE | OROMUCOSAL | Status: DC | PRN

## 2019-11-21 MED ORDER — SODIUM CHLORIDE 0.9 % IV SOLN: INTRAVENOUS | Status: DC | PRN

## 2019-11-21 MED ORDER — BUPIVACAINE IN DEXTROSE 0.75-8.25 % IT SOLN
INTRATHECAL | Status: DC | PRN
  Administered 2019-11-21: 1.6 mL via INTRATHECAL

## 2019-11-21 MED ORDER — ONDANSETRON HCL 4 MG/2ML IJ SOLN
INTRAMUSCULAR | Status: AC
  Filled 2019-11-21: qty 2

## 2019-11-21 MED ORDER — ACETAMINOPHEN 500 MG PO TABS
1000.0000 mg | ORAL_TABLET | Freq: Once | ORAL | Status: AC
  Administered 2019-11-21: 1000 mg via ORAL

## 2019-11-21 MED ORDER — PROMETHAZINE HCL 25 MG/ML IJ SOLN: 6.2500 mg | INTRAMUSCULAR | Status: DC | PRN

## 2019-11-21 MED ORDER — DEXAMETHASONE SODIUM PHOSPHATE 10 MG/ML IJ SOLN
INTRAMUSCULAR | Status: AC
  Filled 2019-11-21: qty 1

## 2019-11-21 MED ORDER — BUPIVACAINE HCL (PF) 0.25 % IJ SOLN
INTRAMUSCULAR | Status: AC
  Filled 2019-11-21: qty 30

## 2019-11-21 MED ORDER — METOCLOPRAMIDE HCL 5 MG/ML IJ SOLN
INTRAMUSCULAR | Status: DC | PRN
  Administered 2019-11-21: 10 mg via INTRAVENOUS

## 2019-11-21 MED ORDER — SIMETHICONE 80 MG PO CHEW
80.0000 mg | CHEWABLE_TABLET | Freq: Three times a day (TID) | ORAL | Status: DC
  Administered 2019-11-22 – 2019-11-24 (×6): 80 mg via ORAL
  Filled 2019-11-21 (×7): qty 1

## 2019-11-21 MED ORDER — CEFAZOLIN SODIUM-DEXTROSE 2-4 GM/100ML-% IV SOLN
INTRAVENOUS | Status: AC
  Filled 2019-11-21: qty 100

## 2019-11-21 MED ORDER — SIMETHICONE 80 MG PO CHEW: 80.0000 mg | CHEWABLE_TABLET | ORAL | Status: DC | PRN

## 2019-11-21 MED ORDER — PRENATAL MULTIVITAMIN CH
1.0000 | ORAL_TABLET | Freq: Every day | ORAL | Status: DC
  Administered 2019-11-22 – 2019-11-23 (×2): 1 via ORAL
  Filled 2019-11-21 (×3): qty 1

## 2019-11-21 MED ORDER — OXYCODONE HCL 5 MG PO TABS
5.0000 mg | ORAL_TABLET | ORAL | Status: DC | PRN
  Administered 2019-11-22 – 2019-11-23 (×3): 5 mg via ORAL
  Filled 2019-11-21 (×3): qty 1

## 2019-11-21 MED ORDER — ONDANSETRON HCL 4 MG/2ML IJ SOLN
INTRAMUSCULAR | Status: DC | PRN
  Administered 2019-11-21 (×2): 4 mg via INTRAVENOUS

## 2019-11-21 MED ORDER — DIBUCAINE (PERIANAL) 1 % EX OINT: 1.0000 "application " | TOPICAL_OINTMENT | CUTANEOUS | Status: DC | PRN

## 2019-11-21 MED ORDER — LACTATED RINGERS IV SOLN: INTRAVENOUS | Status: DC

## 2019-11-21 MED ORDER — BUPIVACAINE HCL (PF) 0.25 % IJ SOLN
INTRAMUSCULAR | Status: DC | PRN
  Administered 2019-11-21: 10 mL

## 2019-11-21 MED ORDER — IBUPROFEN 800 MG PO TABS
800.0000 mg | ORAL_TABLET | Freq: Four times a day (QID) | ORAL | Status: DC
  Administered 2019-11-22 – 2019-11-24 (×8): 800 mg via ORAL
  Filled 2019-11-21 (×8): qty 1

## 2019-11-21 MED ORDER — COCONUT OIL OIL: 1.0000 "application " | TOPICAL_OIL | Status: DC | PRN

## 2019-11-21 MED ORDER — KETOROLAC TROMETHAMINE 30 MG/ML IJ SOLN
INTRAMUSCULAR | Status: AC
  Filled 2019-11-21: qty 1

## 2019-11-21 MED ORDER — ACETAMINOPHEN 500 MG PO TABS
ORAL_TABLET | ORAL | Status: AC
  Filled 2019-11-21: qty 2

## 2019-11-21 MED ORDER — TETANUS-DIPHTH-ACELL PERTUSSIS 5-2.5-18.5 LF-MCG/0.5 IM SUSP: 0.5000 mL | Freq: Once | INTRAMUSCULAR | Status: DC

## 2019-11-21 MED ORDER — SENNOSIDES-DOCUSATE SODIUM 8.6-50 MG PO TABS
2.0000 | ORAL_TABLET | ORAL | Status: DC
  Administered 2019-11-22: 2 via ORAL
  Filled 2019-11-21 (×3): qty 2

## 2019-11-21 SURGICAL SUPPLY — 44 items
BARRIER ADHS 3X4 INTERCEED (GAUZE/BANDAGES/DRESSINGS) ×3 IMPLANT
BENZOIN TINCTURE PRP APPL 2/3 (GAUZE/BANDAGES/DRESSINGS) ×3 IMPLANT
CHLORAPREP W/TINT 26ML (MISCELLANEOUS) ×3 IMPLANT
CLAMP CORD UMBIL (MISCELLANEOUS) IMPLANT
CLOSURE STERI STRIP 1/2 X4 (GAUZE/BANDAGES/DRESSINGS) ×3 IMPLANT
CLOSURE WOUND 1/2 X4 (GAUZE/BANDAGES/DRESSINGS)
CLOTH BEACON ORANGE TIMEOUT ST (SAFETY) ×3 IMPLANT
DRAPE C SECTION CLR SCREEN (DRAPES) ×3 IMPLANT
DRSG OPSITE POSTOP 4X10 (GAUZE/BANDAGES/DRESSINGS) ×3 IMPLANT
ELECT REM PT RETURN 9FT ADLT (ELECTROSURGICAL) ×3
ELECTRODE REM PT RTRN 9FT ADLT (ELECTROSURGICAL) ×1 IMPLANT
EXTRACTOR VACUUM M CUP 4 TUBE (SUCTIONS) IMPLANT
EXTRACTOR VACUUM M CUP 4' TUBE (SUCTIONS)
GLOVE BIOGEL PI IND STRL 7.0 (GLOVE) ×2 IMPLANT
GLOVE BIOGEL PI INDICATOR 7.0 (GLOVE) ×4
GLOVE ECLIPSE 6.5 STRL STRAW (GLOVE) ×3 IMPLANT
GOWN STRL REUS W/TWL LRG LVL3 (GOWN DISPOSABLE) ×6 IMPLANT
KIT ABG SYR 3ML LUER SLIP (SYRINGE) IMPLANT
NEEDLE HYPO 22GX1.5 SAFETY (NEEDLE) ×3 IMPLANT
NEEDLE HYPO 25X5/8 SAFETYGLIDE (NEEDLE) IMPLANT
NS IRRIG 1000ML POUR BTL (IV SOLUTION) ×3 IMPLANT
PACK C SECTION WH (CUSTOM PROCEDURE TRAY) ×3 IMPLANT
PAD OB MATERNITY 4.3X12.25 (PERSONAL CARE ITEMS) ×3 IMPLANT
RTRCTR C-SECT PINK 25CM LRG (MISCELLANEOUS) IMPLANT
STRIP CLOSURE SKIN 1/2X4 (GAUZE/BANDAGES/DRESSINGS) IMPLANT
SUT CHROMIC GUT AB #0 18 (SUTURE) IMPLANT
SUT MNCRL 0 VIOLET CTX 36 (SUTURE) ×3 IMPLANT
SUT MON AB 2-0 SH 27 (SUTURE)
SUT MON AB 2-0 SH27 (SUTURE) IMPLANT
SUT MON AB 3-0 SH 27 (SUTURE)
SUT MON AB 3-0 SH27 (SUTURE) IMPLANT
SUT MON AB 4-0 PS1 27 (SUTURE) IMPLANT
SUT MONOCRYL 0 CTX 36 (SUTURE) ×6
SUT PLAIN 2 0 (SUTURE) ×2
SUT PLAIN 2 0 XLH (SUTURE) IMPLANT
SUT PLAIN ABS 2-0 CT1 27XMFL (SUTURE) ×1 IMPLANT
SUT VIC AB 0 CT1 36 (SUTURE) ×6 IMPLANT
SUT VIC AB 2-0 CT1 27 (SUTURE) ×2
SUT VIC AB 2-0 CT1 TAPERPNT 27 (SUTURE) ×1 IMPLANT
SUT VIC AB 4-0 PS2 27 (SUTURE) IMPLANT
SYR CONTROL 10ML LL (SYRINGE) ×3 IMPLANT
TOWEL OR 17X24 6PK STRL BLUE (TOWEL DISPOSABLE) ×3 IMPLANT
TRAY FOLEY W/BAG SLVR 14FR LF (SET/KITS/TRAYS/PACK) IMPLANT
WATER STERILE IRR 1000ML POUR (IV SOLUTION) ×3 IMPLANT

## 2019-11-21 NOTE — MAU Note (Signed)
Pt here for covid swab, denies any complaints at this time.

## 2019-11-21 NOTE — Lactation Note (Signed)
This note was copied from a baby's chart. Lactation Consultation Note  Patient Name: [name] [id number] Date: 11/21/2019 Reason for consult: Initial assessment;Multiple gestation;Infant < 6lbs;Late-preterm 34-36.6wks   Mom has a 31 yr old that she attempted to bf but had issues with low milk supply per mom.  She collected very little when trying to pump per mom.    LC inquired about feeding goals for twins.  She states she has a Medela DEBP and may attempt to pump when she gets home from the hospital.  Mom then asked LC if there were any tips on battling low milk supply.  LC suggested early milk removal and breast stimulation.    Mom said she may pump tomorrow.  LC explained that she had a pump in her room and if she desired to begin pumping she could call out to her RN and request a pump be set up at any point during her stay.  LC explained the earlier the better and also reviewed consistency with pumping is key in increasing volume collected.  Lactation brochure provided to family and they are aware of lactation resources.     Maternal Data Does the patient have breastfeeding experience prior to this delivery?: Yes  Feeding Feeding Type: Formula Nipple Type: Extra Slow Flow  LATCH Score                   Interventions    Lactation Tools Discussed/Used     Consult Status Consult Status: PRN(Pt will call out if she desires to see an LC)    Maryruth Hancock [facility] 11/21/2019, 6:50 PM

## 2019-11-21 NOTE — Brief Op Note (Signed)
11/21/2019  2:23 PM  PATIENT:  Sherry Pena  29 y.o. 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, GHTN  PROCEDURE:  primary Cesarean section, kerr hysterotomy  SURGEON:  Surgeon(s) and Role:    * Maxie Better, MD - Primary  PHYSICIAN ASSISTANT:   ASSISTANTS: Shea Evans, MD   ANESTHESIA:   spinal Findings: live female ROP, live female LOA CAN x1 twin B, fused appearing placenta x 2  cords EBL:  254  BLOOD ADMINISTERED:none  DRAINS: none   LOCAL MEDICATIONS USED:  MARCAINE     SPECIMEN:  Source of Specimen:  placenta x 2  DISPOSITION OF SPECIMEN:  PATHOLOGY  COUNTS:  YES  TOURNIQUET:  * No tourniquets in log *  DICTATION: .Other Dictation: Dictation Number [id number]  PLAN OF CARE: Admit to inpatient   PATIENT DISPOSITION:  PACU - hemodynamically stable.   Delay start of Pharmacological VTE agent (>24hrs) due to surgical blood loss or risk of bleeding: no

## 2019-11-21 NOTE — Progress Notes (Signed)
Baby A= FHR dopplered at 138-141 Baby B= FHR dopplered at [address] [place], [place] 11/21/2019 10:47 AM

## 2019-11-21 NOTE — H&P (Signed)
Sherry Pena is a 29 y.o. female presenting for primary cesarean section @ 36 1/7 week due to IUGR x 2 twin gestation. Prenatal care initially complicated by IUGR of twin A then yesterday MFM reports twin B is also IUGR. BMZ complete 2/3, 2/4 Pt has had leg swelling and elev BP w/o lab dx of preeclampsia.  OB History    Gravida  3   Para  1   Term  1   Preterm      AB  1   Living  1     SAB  1   TAB      Ectopic      Multiple  0   Live Births  1          Past Medical History:  Diagnosis Date  . Anxiety   . Gestational hypertension   . History of genital warts   . HPV (human papilloma virus) infection   . Ovarian cyst   . Vaginal Pap smear, abnormal    Past Surgical History:  Procedure Laterality Date  . genital warts     Laser removal 2011  . WISDOM TOOTH EXTRACTION     Family History: family history includes Diabetes in her maternal grandmother and paternal grandmother; Hypertension in her paternal grandmother. Social History:  reports that she has quit smoking. Her smoking use included cigarettes. She has a 3.00 pack-year smoking history. She has never used smokeless tobacco. She reports previous alcohol use. She reports that she does not use drugs.     Maternal Diabetes: No Genetic Screening: Normal Maternal Ultrasounds/Referrals: IUGR and Other:twins Fetal Ultrasounds or other Referrals:  None Maternal Substance Abuse:  No Significant Maternal Medications:  None Significant Maternal Lab Results:  Group B Strep negative Other Comments:  twin ( di-di)  IUGR x 2, SS trait  Review of Systems  Cardiovascular: Positive for leg swelling.  Gastrointestinal: Negative for nausea and vomiting.  Neurological: Negative for headaches.  All other systems reviewed and are negative.  Maternal Medical History:  Reason for admission: Nausea.      Blood pressure 137/87, pulse 95, temperature 98.3 F (36.8 C), temperature source Oral, resp. rate 18, height  5' (1.524 m), weight 76.7 kg, last menstrual period 03/13/2019, SpO2 100 %. Exam Physical Exam  Constitutional: She is oriented to person, place, and time. She appears well-developed and well-nourished.  HENT:  Head: Atraumatic.  Eyes: EOM are normal.  Cardiovascular: Regular rhythm.  Respiratory: Breath sounds normal.  GI: Soft.  Musculoskeletal:        General: Edema present.     Cervical back: Neck supple.     Comments: 2+  Neurological: She is alert and oriented to person, place, and time. She has normal reflexes.  Skin: Skin is warm and dry.  Psychiatric: She has a normal mood and affect.    Prenatal labs: ABO, Rh: O/Positive/-- (08/04 0000) Antibody: Negative (08/04 0000) Rubella: Immune (08/04 0000) RPR: Nonreactive (08/04 0000)  HBsAg: Negative (08/04 0000)  HIV: Non-reactive (08/04 0000)  GBS:   negative  Assessment/Plan: IUGR x 2 twin gestation @ 36 1/7  Gestational HTN P) Primary C/S. Risk of surgery includes infection, bleeding poss need for blood transfusion and its risk( HIV, fever, hepatitis), injury to bladder, bowel, ureter. Internal scar tissue. All ? Answered. Repeat PIH labs today   Samyak Sackmann A Rogena Deupree 11/21/2019, 10:14 AM

## 2019-11-21 NOTE — Transfer of Care (Signed)
Immediate Anesthesia Transfer of Care Note  Patient: Sherry Pena  Procedure(s) Performed: Primary CESAREAN SECTION MULTI-GESTATIONAL (N/A )  Patient Location: PACU  Anesthesia Type:Spinal  Level of Consciousness: awake, alert  and oriented  Airway & Oxygen Therapy: Patient Spontanous Breathing  Post-op Assessment: Report given to RN and Post -op Vital signs reviewed and stable  Post vital signs: Reviewed and stable  Last Vitals:  Vitals Value Taken Time  BP 148/92 11/21/19 1435  Temp    Pulse 81 11/21/19 1436  Resp 18 11/21/19 1436  SpO2 99 % 11/21/19 1436  Vitals shown include unvalidated device data.  Last Pain:  Vitals:   11/21/19 1004  TempSrc: Oral         Complications: No apparent anesthesia complications

## 2019-11-21 NOTE — Anesthesia Preprocedure Evaluation (Addendum)
Anesthesia Evaluation  Patient identified by MRN, date of birth, ID band Patient awake    Reviewed: Allergy & Precautions, NPO status , Patient's Chart, lab work & pertinent test results  Airway Mallampati: II  TM Distance: >3 FB Neck ROM: Full    Dental no notable dental hx.    Pulmonary former smoker,    Pulmonary exam normal breath sounds clear to auscultation       Cardiovascular hypertension (Gestational), Normal cardiovascular exam Rhythm:Regular Rate:Normal     Neuro/Psych Anxiety negative neurological ROS     GI/Hepatic negative GI ROS, Neg liver ROS,   Endo/Other  negative endocrine ROS  Renal/GU negative Renal ROS     Musculoskeletal negative musculoskeletal ROS (+)   Abdominal   Peds  Hematology  (+) anemia ,   Anesthesia Other Findings Twins, Intra-Uterine Growth Restriction of Twin A, Malpresentation  Reproductive/Obstetrics (+) Pregnancy                            Anesthesia Physical Anesthesia Plan  ASA: II  Anesthesia Plan: Spinal   Post-op Pain Management:    Induction:   PONV Risk Score and Plan: 2 and Ondansetron, Dexamethasone and Treatment may vary due to age or medical condition  Airway Management Planned: Natural Airway  Additional Equipment:   Intra-op Plan:   Post-operative Plan:   Informed Consent: I have reviewed the patients History and Physical, chart, labs and discussed the procedure including the risks, benefits and alternatives for the proposed anesthesia with the patient or authorized representative who has indicated his/her understanding and acceptance.     Dental advisory given  Plan Discussed with: CRNA  Anesthesia Plan Comments:        Anesthesia Quick Evaluation

## 2019-11-21 NOTE — Anesthesia Postprocedure Evaluation (Signed)
Anesthesia Post Note  Patient: Sherry Pena  Procedure(s) Performed: Primary CESAREAN SECTION MULTI-GESTATIONAL (N/A )     Patient location during evaluation: PACU Anesthesia Type: Spinal Level of consciousness: oriented and awake and alert Pain management: pain level controlled Vital Signs Assessment: post-procedure vital signs reviewed and stable Respiratory status: spontaneous breathing, respiratory function stable and patient connected to nasal cannula oxygen Cardiovascular status: blood pressure returned to baseline and stable Postop Assessment: no headache, no backache, no apparent nausea or vomiting and spinal receding Anesthetic complications: no    Last Vitals:  Vitals:   11/21/19 1534 11/21/19 1545  BP: (!) 157/98 (!) 145/73  Pulse: 72 77  Resp: 14 18  Temp:    SpO2: 100% 100%    Last Pain:  Vitals:   11/21/19 1515  TempSrc:   PainSc: 0-No pain   Pain Goal:    LLE Motor Response: Purposeful movement (11/21/19 1500) LLE Sensation: Other (Comment)("Heavy") (11/21/19 1500) RLE Motor Response: Purposeful movement (11/21/19 1500) RLE Sensation: Other (Comment)("Heavy") (11/21/19 1500) L Sensory Level: L5-Outer lower leg, top of foot, great toe (11/21/19 1534) R Sensory Level: L5-Outer lower leg, top of foot, great toe (11/21/19 1534) Epidural/Spinal Function Cutaneous sensation: Able to Discern Pressure (11/21/19 1534), Patient able to flex knees: Yes (11/21/19 1534), Patient able to lift hips off bed: Yes (11/21/19 1534), Back pain beyond tenderness at insertion site: No (11/21/19 1534), Progressively worsening motor and/or sensory loss: No (11/21/19 1534), Bowel and/or bladder incontinence post epidural: No (11/21/19 1534)  [name] P [name]

## 2019-11-22 LAB — CBC
HCT: 25.5 % — ABNORMAL LOW (ref 36.0–46.0)
Hemoglobin: 8.4 g/dL — ABNORMAL LOW (ref 12.0–15.0)
MCH: 25.5 pg — ABNORMAL LOW (ref 26.0–34.0)
MCHC: 32.9 g/dL (ref 30.0–36.0)
MCV: 77.3 fL — ABNORMAL LOW (ref 80.0–100.0)
Platelets: 192 10*3/uL (ref 150–400)
RBC: 3.3 MIL/uL — ABNORMAL LOW (ref 3.87–5.11)
RDW: 16.6 % — ABNORMAL HIGH (ref 11.5–15.5)
WBC: 19.8 10*3/uL — ABNORMAL HIGH (ref 4.0–10.5)
nRBC: 0 % (ref 0.0–0.2)

## 2019-11-22 LAB — RPR: RPR Ser Ql: NONREACTIVE

## 2019-11-22 MED ORDER — POLYSACCHARIDE IRON COMPLEX 150 MG PO CAPS
150.0000 mg | ORAL_CAPSULE | Freq: Every day | ORAL | Status: DC
  Administered 2019-11-23 – 2019-11-24 (×2): 150 mg via ORAL
  Filled 2019-11-22 (×2): qty 1

## 2019-11-22 NOTE — Progress Notes (Signed)
CSW acknowledged consult and attempted to meet with MOB. However, NP was attending to MOB and infants.  CSW will meet with MOB at a later time.  Lamoyne Hessel D. Marshia Tropea, MSW, LCSWA Clinical Social Worker [phone] 

## 2019-11-22 NOTE — Op Note (Signed)
NAME: Sherry Pena, Sherry Pena MEDICAL RECORD [id number] ACCOUNT [id number] DATE OF BIRTH:[birth year] FACILITY: [facility] LOCATION: [facility] PHYSICIAN:Larry Alcock A. Gino Garrabrant, MD  OPERATIVE REPORT  DATE OF PROCEDURE:  11/21/2019  PREOPERATIVE DIAGNOSES:  Intrauterine growth restriction of  twin A and B, diamniotic dichorionic twin gestation at 24 and 1/7 weeks', gestational hypertension.  PROCEDURE:  Primary cesarean section, Kerr hysterotomy.  POSTOPERATIVE DIAGNOSES:  Intrauterine growth restriction of twin A and B, diamniotic dichorionic twin gestation at 65 and 1/7 weeks', gestational hypertension.  ANESTHESIA:  Spinal.  SURGEON:  Maxie Better, MD  ASSISTANT:  Shea Evans, MD  DESCRIPTION OF PROCEDURE:  Under adequate spinal anesthesia, the patient was placed in the supine position with a left lateral tilt.  She was sterilely prepped and draped in the usual fashion.  An indwelling Foley catheter was sterilely placed.  Marcaine  0.25% was injected along the planned Pfannenstiel skin incision site.  Pfannenstiel skin incision was then made, carried down to the rectus fascia.  Rectus fascia was opened transversely.  Rectus fascia was then bluntly and sharply dissected off the  rectus muscle in superior and inferior fashion.  The rectus muscle was split in the midline.  The parietal peritoneum was entered bluntly and extended.  A self-retaining Alexis retractor was then placed.  Curvilinear low transverse uterine incision was  made after the vesicouterine peritoneum was opened transversely and the bladder displaced inferiorly.  The uterine incision was extended bluntly.  Subsequently, artificial rupture of membranes occurred.  Clear amniotic fluid.  Subsequent delivery of a  live female from the maternal right from a right occiput posterior position.  The baby was subsequently delivered, placed on the patient's abdomen.  Artificial rupture of membranes of the 2nd sac was then accomplished.   Subsequent delivery of the live  female from a left occiput anterior position with nuchal cord x1, which was reducible, was accomplished.  Delayed cord clamping was done for both babies.  Cords were subsequently clamped, cut and the babies were transferred to the awaiting pediatricians,  who assigned Apgars of 8 and 9 at one and five minutes for twin A and Apgars of 9 and 9 at one and five minutes for twin B.  The placenta was manually removed, appears possibly fused; however, was sent to pathology.  Uterine cavity was cleaned of debris.   Uterine incision had no extension.  Uterine incision was closed in 2 layers, the first layer of 0 Monocryl running lock stitch, second layer was imbricating using 0 Monocryl suture and a figure-of-eight suture was placed on the right of the incision  for bleeding.  The abdomen was irrigated and suctioned of debris.  The Alexis retractor was then removed.  Interceed was placed overlying the incision in an inverted T fashion.  The parietal peritoneum was then closed with 2-0 Vicryl.  The rectus fascia  was closed with 0 Vicryl x2.  The subcutaneous area was irrigated, small bleeders cauterized.  Interrupted 2-0 plain sutures placed and the skin approximated using 4-0 Vicryl subcuticular closure.  Steri-Strips and benzoin was then placed.  SPECIMEN:  Placenta x2 sent to pathology.  ESTIMATED BLOOD LOSS:  254 mL.  INTRAOPERATIVE FLUIDS:  1200 mL.  URINE OUTPUT:  200 mL.  COUNTS:  Sponge and instrument counts x2 were correct.  COMPLICATIONS:  None.  DISPOSITION:  The patient tolerated the procedure well and was transferred to recovery room in stable condition.  JN/NUANCE  D:11/21/2019 T:11/22/2019 JOB:[id number]

## 2019-11-22 NOTE — Progress Notes (Signed)
Subjective: [id number]. Postpartum/ POD #1. Primary elective urgent LTCS at 36.1 wks for DiDi Twins with IUGR both twins - Girl and Boy (has a 31 yo daughter at home) GHTN at admission, no meds  Patient reports feeling well. Ambulating, voiding since foley out, tolerating regular diet. Pain well controlled   Objective: Vital signs in last 24 hours: Temp:  [97.7 F (36.5 C)-98.8 F (37.1 C)] 98 F (36.7 C) (03/14 0959) Pulse Rate:  [61-92] 61 (03/14 0959) Resp:  [4-20] 17 (03/14 0959) BP: (123-166)/(59-101) 128/82 (03/14 0959) SpO2:  [99 %-100 %] 100 % (03/14 0959)  Physical Exam:  General: alert and cooperative  Lungs CTA bilat CV RRR Lochia: appropriate Uterine Fundus: firm at Umb Abdo soft, NABS, non acute Incision: no significant drainage DVT Evaluation: No evidence of DVT seen on physical exam.  CBC Latest Ref Rng & Units 11/22/2019 11/21/2019 11/14/2019  WBC 4.0 - 10.5 K/uL 19.8(H) 10.1 10.7(H)  Hemoglobin 12.0 - 15.0 g/dL 4.4(B) 10.4(L) 9.9(L)  Hematocrit 36.0 - 46.0 % 25.5(L) 32.4(L) 30.0(L)  Platelets 150 - 400 K/uL 192 235 232    Assessment/Plan: Status post Cesarean section. Doing well postoperatively. continue pp and post-op care  Breast and formula feeding as needed, will start to pump here GHTN- BPs good, no meds, continue to monitor  Chronic anemia with ABL -- needs iron   Twins (girl and boy)  stable, in room, but working on temp to improve this morning, so mom and dad doing skin to skin Boy- circ by Dr Cherly Hensen before discharge   Robley Fries 11/22/2019, 12:14 PM

## 2019-11-22 NOTE — Lactation Note (Signed)
This note was copied from a baby's chart. Lactation Consultation Note Noted left for Lactation not to go see mom unless they are requested by mom.  Patient Name: Sherry Pena [id number] Date: 11/22/2019     Maternal Data    Feeding Feeding Type: Bottle Fed - Formula Nipple Type: Slow - flow  LATCH Score                   Interventions    Lactation Tools Discussed/Used     Consult Status      Kennett Symes, Diamond Nickel 11/22/2019, 9:28 PM

## 2019-11-23 ENCOUNTER — Encounter (HOSPITAL_COMMUNITY): Payer: Self-pay | Admitting: Obstetrics and Gynecology

## 2019-11-23 DIAGNOSIS — O9902 Anemia complicating childbirth: Secondary | ICD-10-CM | POA: Diagnosis present

## 2019-11-23 MED ORDER — ACETAMINOPHEN 500 MG PO TABS
1000.0000 mg | ORAL_TABLET | Freq: Four times a day (QID) | ORAL | Status: DC
  Administered 2019-11-23 – 2019-11-24 (×3): 1000 mg via ORAL
  Filled 2019-11-23 (×3): qty 2

## 2019-11-23 MED ORDER — MAGNESIUM OXIDE 400 (241.3 MG) MG PO TABS
400.0000 mg | ORAL_TABLET | Freq: Every day | ORAL | Status: DC
  Administered 2019-11-24: 400 mg via ORAL
  Filled 2019-11-23: qty 1

## 2019-11-23 NOTE — Progress Notes (Signed)
Patient ID: Sherry Pena, female   DOB: Mar 26, 1991, 29 y.o.   MRN: [id number] Primary elective urgent LTCS at 36.1 wks for DiDi Twins with IUGR both twins - Girl and Boy (has a 38 yo daughter at home) GHTN at admission, no meds  Subjective: POD# 2   Charisma, Charlot Girl Sukanya [[id number]]  Live born female [place] Birth Weight: 4 lb 6 oz (1985 g) APGAR: 8, 9  Newborn Delivery   Birth date/time: 11/21/2019 13:37:00 Delivery type: C-Section, Low Transverse Trial of labor: No C-section categorization: Primary       Jeanita, Carneiro [[id number]]  Live born female [place] Birth Weight: 4 lb 11.8 oz (2150 g) APGAR: 9, 9  Newborn Delivery   Birth date/time: 11/21/2019 13:37:00 Delivery type: C-Section, Low Transverse Trial of labor: No C-section categorization: Primary      Delivering provider:    Pooja, Camuso [[id number]]  COUSINS, Makeisha, Jentsch [[id number]]  COUSINS, SHERONETTE    circumcision pending for [place]ick Feeding: breast and bottle  Pain control at delivery:    Nakshatra, Klose [[id number]]  Spinal    Amee, Boothe Sharyon Cable [[id number]]  Spinal    Reports feeling well.  Patient reports tolerating PO.   Breast symptoms:+ colostrum Pain controlled with PO meds Denies HA/SOB/C/P/N/V/dizziness. Flatus present. She reports vaginal bleeding as normal, without clots.  She is ambulating, urinating without difficulty.     Objective:   VS:    Vitals:   11/22/19 0520 11/22/19 0959 11/22/19 2131 11/23/19 0545  BP: 123/62 128/82 137/77 128/68  Pulse: 80 61 70 81  Resp: 16 17 17 16   Temp: 98.4 F (36.9 C) 98 F (36.7 C) 98.3 F (36.8 C) 98.1 F (36.7 C)  TempSrc:  Oral Oral Oral  SpO2: 100% 100% 99% 100%  Weight:      Height:          Intake/Output Summary (Last 24 hours) at 11/23/2019 1114 Last data filed at 11/23/2019 0500 Gross per 24 hour  Intake 0 ml  Output --  Net 0 ml        Recent Labs     11/21/19 1015 11/22/19 0635  WBC 10.1 19.8*  HGB 10.4* 8.4*  HCT 32.4* 25.5*  PLT 235 192     Blood type: --/--/O POS, O POS Performed at [facility], [address] [place], [place]  (03/13 1026)  Rubella: Immune (08/04 0000)  Vaccines: TDaP UTD         Flu    declined   Physical Exam:  General: alert, cooperative and no distress Abdomen: soft, nontender, normal bowel sounds Incision: clean, dry and honeycomb not placed over steristrips and open on inferior edge Uterine Fundus: firm, below umbilicus, nontender Lochia: minimal Ext: edema +1 pedal, no cords or tenderness      Assessment/Plan: 29 y.o.   POD# 2. 31                  Principal Problem:   Postpartum care following cesarean delivery 3/13 Active Problems:   Cesarean delivery delivered - Twins IUGR [redacted] wks   Gestational hypertension  - normotensive, no neural sx, labs normal   Maternal anemia, with delivery  - asymptomatic, started oral Fe and Mag Ox   Doing well, stable.              - change honeycomb dressing today for malpositioned Routine post-op care  4/13, CNM, MSN 11/23/2019,  11:14 AM

## 2019-11-23 NOTE — Progress Notes (Signed)
CSW received consult for hx of Anxiety, Depression, and PPD.  CSW met with MOB to offer support and complete assessment.     CSW congratulated MOB on the birth of infants. CSW advised MOB of CSW's role and the reason for CSW coming to speak with her. MOB was very open and pleasant in speaking with CSW. MOB reported that's he has anxiety during this pregnancy. MOB reported that having twins was unexpected for her and FOB. MOB reported concerns and anxiety initially around getting items for infants and "how to afford/what are we going to do?". MOB reported to CSW that she was placed on Wellbutrin during this pregnancy and reported that this helped with her anxiety along with decreasing smoking sensations. MOB reported that since being able to get tongs prepared for infants she feels better. MOB expressed that she has all needed items for both babies and reports that she has friends and family that are willing to help in any way needed.   MOB reported that she isn't feeling SI or HI and reports that she is not involved in DV. MOB expressed to CSW that she has a four year old daughter that lives in the home with her and FOB. MOB informed CSW that she feels happy and not anxious now that babies are.   CSW provided education regarding the baby blues period vs. perinatal mood disorders, discussed treatment and gave resources for mental health follow up if concerns arise.  CSW recommends self-evaluation during the postpartum time period using the New Mom Checklist from Postpartum Progress and encouraged MOB to contact a medical professional if symptoms are noted at any time.   CSW provided review of Sudden Infant Death Syndrome (SIDS) precautions.   CSW identifies no further need for intervention and no barriers to discharge at this time.   Anyely Cunning S. Antonia Culbertson, MSW, LCSW [facility] [phone]   

## 2019-11-24 LAB — SURGICAL PATHOLOGY

## 2019-11-24 MED ORDER — IBUPROFEN 800 MG PO TABS: 800.0000 mg | ORAL_TABLET | Freq: Four times a day (QID) | ORAL | 0 refills | Status: AC

## 2019-11-24 MED ORDER — COCONUT OIL OIL: 1.0000 "application " | TOPICAL_OIL | 0 refills | Status: AC | PRN

## 2019-11-24 MED ORDER — SENNOSIDES-DOCUSATE SODIUM 8.6-50 MG PO TABS: 2.0000 | ORAL_TABLET | Freq: Every evening | ORAL | 0 refills | Status: AC | PRN

## 2019-11-24 MED ORDER — OXYCODONE HCL 5 MG PO TABS: 5.0000 mg | ORAL_TABLET | ORAL | 0 refills | Status: AC | PRN

## 2019-11-24 MED ORDER — POLYSACCHARIDE IRON COMPLEX 150 MG PO CAPS: 150.0000 mg | ORAL_CAPSULE | Freq: Every day | ORAL | 3 refills | Status: AC

## 2019-11-24 MED ORDER — ACETAMINOPHEN 500 MG PO TABS: 1000.0000 mg | ORAL_TABLET | Freq: Four times a day (QID) | ORAL | 0 refills | Status: AC

## 2019-11-24 NOTE — Discharge Summary (Addendum)
Obstetric Discharge Summary   Patient Name: Sherry Pena DOB: [birth year] MRN: [id number]  Date of Admission: 11/21/2019 Date of Discharge: 11/24/2019 Date of Delivery: 11/21/2019 Gestational Age at Delivery: [redacted]w[redacted]d  Primary OB: [facility] - Dr. Cherly Hensen  Antepartum complications:  - Gestational hypertension - Mercie Eon Twins with IUGR - SS trait Prenatal Labs:  ABO, Rh: O/Positive/-- (08/04 0000) Antibody: Negative (08/04 0000) Rubella: Immune (08/04 0000) RPR: Nonreactive (08/04 0000)  HBsAg: Negative (08/04 0000)  HIV: Non-reactive (08/04 0000)  GBS:   negative Admitting Diagnosis: 36+1 weeks primary c/s for , IUGR x 2, gestational hypertension, Mercie Eon twins  Secondary Diagnoses: Patient Active Problem List   Diagnosis Date Noted   Maternal anemia, with delivery 11/23/2019   Cesarean delivery delivered - Twins IUGR 36 wks 11/21/2019   Postpartum care following cesarean delivery 3/13 11/21/2019   Gestational hypertension 11/21/2019   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 Cherly Hensen Delivery Type: primary cesarean section, low transverse incision Anesthesia: spinal  Newborn Data:   Estellar, Cadena [[id number]]  Live born female  Birth Weight: 4 lb 6 oz (1985 g) APGAR: 8, 9  Newborn Delivery   Birth date/time: 11/21/2019 13:37:00 Delivery type: C-Section, Low Transverse Trial of labor: No C-section categorization: Primary       Lainy, Wrobleski [[id number]]  Live born female  Birth Weight: 4 lb 11.8 oz (2150 g) APGAR: 9, 9  Newborn Delivery   Birth date/time: 11/21/2019 13:37:00 Delivery type: C-Section, Low Transverse Trial of labor: No C-section categorization: Primary         Hospital/Postpartum Course  (Cesarean Section):  Pt. Admitted at 36+1 weeks for primary LTCS for Mercie Eon twins with IUGR x 2. Pt was found to have  gestational hypertension on admission. PIH labs done did not reveal preeclampsia.  See  notes and delivery summary for details. Patient had an uncomplicated postpartum course.  Her blood pressures improved after delivery and she never required antihypertensives.  By time of discharge on POD#3, her pain was controlled on oral pain medications; she had appropriate lochia and was ambulating, voiding without difficulty, tolerating regular diet and passing flatus.   She was deemed stable for discharge to home.     Labs: CBC Latest Ref Rng & Units 11/22/2019 11/21/2019 11/14/2019  WBC 4.0 - 10.5 K/uL 19.8(H) 10.1 10.7(H)  Hemoglobin 12.0 - 15.0 g/dL 4.4(Y) 10.4(L) 9.9(L)  Hematocrit 36.0 - 46.0 % 25.5(L) 32.4(L) 30.0(L)  Platelets 150 - 400 K/uL 192 235 232   Conflict (See Lab Report): O POS/O POS Performed at [facility], [address], [place], [place] [address]   Physical exam:  BP 117/63 (BP Location: Right Arm)   Pulse 83   Temp 98.7 F (37.1 C) (Oral)   Resp 18   Ht 5' (1.524 m)   Wt 76.7 kg   LMP 03/13/2019 (Exact Date)   SpO2 100%   Breastfeeding Unknown   BMI 33.01 kg/m  General: alert and no distress Pulm: normal respiratory effort Lochia: appropriate Abdomen: soft, NT Uterine Fundus: firm, below umbilicus Perineum: healing well, no significant erythema, no significant edema Incision: c/d/i, healing well, no significant drainage, no dehiscence, no significant erythema Extremities: No evidence of DVT seen on physical exam. +1 lower extremity edema.   Disposition: stable, discharge to home Baby Feeding: breast milk and formula Baby Disposition: home with mom  Rh Immune globulin given: N/A Rubella vaccine given: N/A Tdap vaccine given in AP  or PP setting: UTD Flu vaccine given in AP or PP setting: declined    Plan:  Sherry Pena was discharged to home in good condition. Follow-up appointment at [facility] in 1 week for BP check.  Discharge Instructions: Per After Visit Summary. Refer to After Visit Summary and [facility] discharge  booklet  Discharge Instructions     Ambulatory referral to Lactation   Complete by: As directed    Reason for consult: The Mother-Infant Dyad Needs Assistance in the Continuation of Breastfeeding   Call MD for:   Complete by: As directed    Call for signs/symptoms of postpartum depression/anxiety, if you have thoughts of hurting yourself, your baby or others  Call for signs/symptoms of high blood pressure - headaches, changes in vision (spots/stars/blurry), right upper abdominal pain or epigastric pain   Call MD for:  difficulty breathing, headache or visual disturbances   Complete by: As directed    Call MD for:  extreme fatigue   Complete by: As directed    Call MD for:  hives   Complete by: As directed    Call MD for:  persistant dizziness or light-headedness   Complete by: As directed    Call MD for:  persistant nausea and vomiting   Complete by: As directed    Call MD for:  redness, tenderness, or signs of infection (pain, swelling, redness, odor or green/yellow discharge around incision site)   Complete by: As directed    Call MD for:  severe uncontrolled pain   Complete by: As directed    Call MD for:  temperature >100.4   Complete by: As directed    Diet - low sodium heart healthy   Complete by: As directed    Driving restriction    Complete by: As directed    Avoid driving for 2 weeks and while taking Oxycodone   Lifting restrictions   Complete by: As directed    Weight restriction of 15-20 lbs. For 2 weeks   Remove dressing in 48 hours   Complete by: As directed    Keep incision clean and dry; okay to leave steri-strips in place for 1-2 weeks then remove them.   Sexual activity   Complete by: As directed    No sexual activity for 6 weeks      Activity: Advance as tolerated. Pelvic rest for 6 weeks.   Diet: Regular, Heart Healthy Discharge Medications: Allergies as of 11/24/2019   No Known Allergies      Medication List     STOP taking these medications     ferrous sulfate 325 (65 FE) MG tablet       TAKE these medications    acetaminophen 500 MG tablet Commonly known as: TYLENOL Take 2 tablets (1,000 mg total) by mouth every 6 (six) hours.   calcium carbonate 500 MG chewable tablet Commonly known as: TUMS - dosed in mg elemental calcium Chew 1 tablet by mouth 2 (two) times daily as needed for indigestion or heartburn.   coconut oil Oil Apply 1 application topically as needed.   ibuprofen 800 MG tablet Commonly known as: ADVIL Take 1 tablet (800 mg total) by mouth every 6 (six) hours.   iron polysaccharides 150 MG capsule Commonly known as: NIFEREX Take 1 capsule (150 mg total) by mouth daily.   oxyCODONE 5 MG immediate release tablet Commonly known as: Oxy IR/ROXICODONE Take 1-2 tablets (5-10 mg total) by mouth every 4 (four) hours as needed for moderate pain.  prenatal multivitamin Tabs tablet Take 1 tablet by mouth daily at 12 noon.   senna-docusate 8.6-50 MG tablet Commonly known as: Senokot-S Take 2 tablets by mouth at bedtime as needed for mild constipation.       Outpatient follow up:  Follow-up Information     Servando Salina, MD. Schedule an appointment as soon as possible for a visit in 1 week(s).   Specialty: Obstetrics and Gynecology Why: Blood pressure check  Contact information: [place] [phone] [phone]            Signed:  Lars Pinks, MSN, CNM [facility]

## 2019-11-24 NOTE — Progress Notes (Signed)
POSTOPERATIVE DAY # 3 S/P Primary LTCS for GHTN and Mercie Eon twins with IUGR, baby girl "[place]" and baby boy "[place]"   S:         Reports feeling well. Ready to go home.   Denies HA, visual changes, RUQ/epigastric pain              Tolerating po intake / no nausea / no vomiting / + flatus / + BM  Denies dizziness, SOB, or CP             Bleeding is light             Pain controlled with Motrin, Tylenol, Oxy IR             Up ad lib / ambulatory/ voiding QS  Newborn breast and formula feeding  / Circumcision - completed    O:  VS: BP 117/63 (BP Location: Right Arm)   Pulse 83   Temp 98.7 F (37.1 C) (Oral)   Resp 18   Ht 5' (1.524 m)   Wt 76.7 kg   LMP 03/13/2019 (Exact Date)   SpO2 100%   Breastfeeding Unknown   BMI 33.01 kg/m  Patient Vitals for the past 24 hrs:  BP Temp Temp src Pulse Resp  11/24/19 0555 117/63 98.7 F (37.1 C) Oral 83 --  11/23/19 2124 136/86 98.5 F (36.9 C) Oral 98 18    LABS:               Recent Labs    11/22/19 0635  WBC 19.8*  HGB 8.4*  PLT 192               Bloodtype: --/--/O POS, O POS Performed at [facility], [address], [place], [place] [address]  [phone] 1026)  Rubella: Immune (08/04 0000)                                             I&O: Intake/Output      03/15 0701 - 03/16 0700 03/16 0701 - 03/17 0700   I.V. (mL/kg)     Total Intake(mL/kg)     Urine (mL/kg/hr)     Total Output     Net                       Physical Exam:             General: alert and no distress Pulm: normal respiratory effort Lochia: appropriate Abdomen: soft, NT Uterine Fundus: firm, below umbilicus Perineum: healing well, no significant erythema, no significant edema Incision: c/d/i, healing well, no significant drainage, no dehiscence, no significant erythema Extremities: No evidence of DVT seen on physical exam. +1 lower extremity edema.  A/P:    POD # 3 S/P Primary LTCS            Gestational HTN   - BPs normal, not on meds   -  no neural s/s  ABL Anemia compounding chronic IDA   - stable on oral FE  Routine postoperative care              Discharge home  [facility] discharge book given, instructions and warning s/s reviewed  F/u for BP check in 1 week  Carlean Jews, MSN, CNM [facility]

## 2019-11-25 ENCOUNTER — Other Ambulatory Visit (HOSPITAL_COMMUNITY)
Admission: RE | Admit: 2019-11-25 | Discharge: 2019-11-25 | Disposition: A | Discharge status: Home / Self Care | Payer: BC Managed Care – PPO | Source: Ambulatory Visit

## 2019-11-25 HISTORY — DX: Unspecified ovarian cyst, unspecified side: N83.209

## 2019-11-25 HISTORY — DX: Unspecified abnormal cytological findings in specimens from vagina: R87.629

## 2019-11-25 HISTORY — DX: Anxiety disorder, unspecified: F41.9

## 2021-05-26 ENCOUNTER — Other Ambulatory Visit: Payer: Self-pay | Admitting: Obstetrics & Gynecology

## 2021-05-26 DIAGNOSIS — N63 Unspecified lump in unspecified breast: Secondary | ICD-10-CM

## 2021-06-08 ENCOUNTER — Other Ambulatory Visit: Payer: BC Managed Care – PPO

## 2021-06-12 ENCOUNTER — Other Ambulatory Visit: Payer: Self-pay

## 2021-06-12 ENCOUNTER — Ambulatory Visit
Admission: RE | Admit: 2021-06-12 | Discharge: 2021-06-12 | Disposition: A | Discharge status: Home / Self Care | Payer: BC Managed Care – PPO | Source: Ambulatory Visit | Attending: Obstetrics & Gynecology | Admitting: Obstetrics & Gynecology

## 2021-06-12 ENCOUNTER — Ambulatory Visit
Admission: RE | Admit: 2021-06-12 | Discharge: 2021-06-12 | Disposition: A | Discharge status: Home / Self Care | Payer: Self-pay | Source: Ambulatory Visit | Attending: Obstetrics & Gynecology | Admitting: Obstetrics & Gynecology

## 2021-06-12 DIAGNOSIS — N63 Unspecified lump in unspecified breast: Secondary | ICD-10-CM

## 2021-12-23 IMAGING — MG DIGITAL DIAGNOSTIC BILAT W/ TOMO W/ CAD
6 of 10 series · 6 of 30 positions shown · non-contrast
Comparison: Previous ultrasound dated 08/10/2016.

CLINICAL DATA: Ordering physician describes a palpable lump in the
upper-outer quadrant of the LEFT breast.

History of previous LEFT breast ultrasound in 8756 which showed a
benign mass at the 2 o'clock axis, compatible with benign
fibroadenoma.
EXAM:
DIGITAL DIAGNOSTIC BILATERAL MAMMOGRAM WITH TOMOSYNTHESIS AND CAD;
ULTRASOUND LEFT BREAST LIMITED
TECHNIQUE: Bilateral digital diagnostic mammography and breast tomosynthesis
was performed. The images were evaluated with computer-aided
detection.; Targeted ultrasound examination of the left breast was
performed.

[L TAN synth-2D]
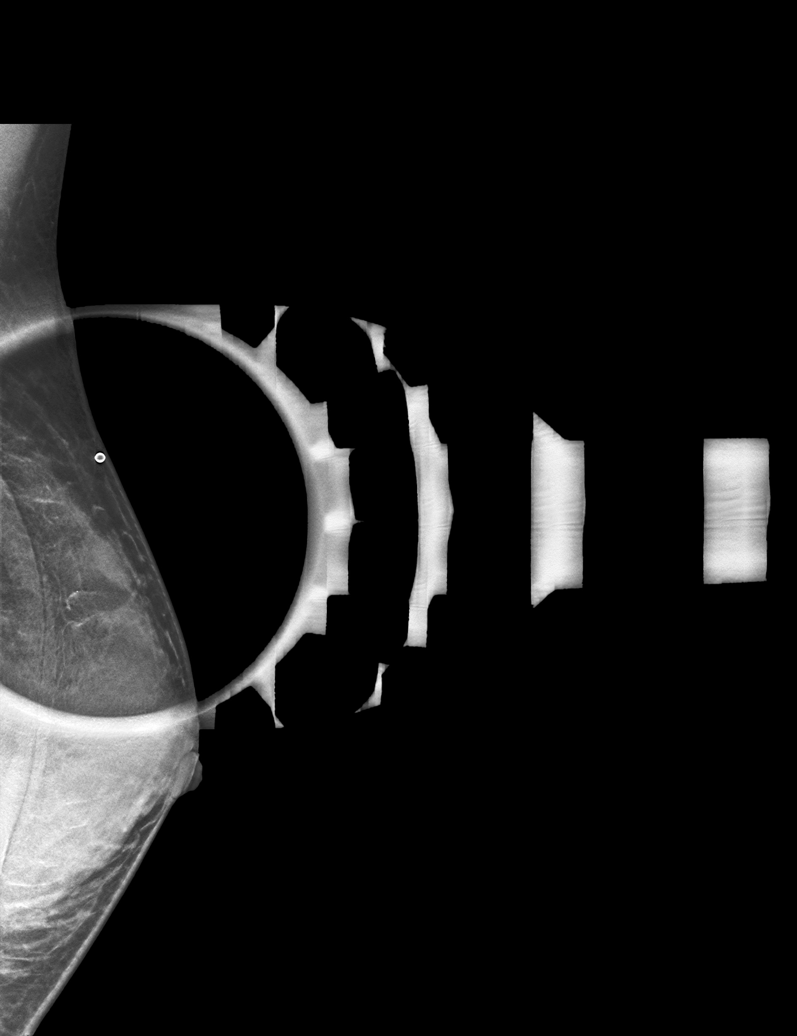

[L MLO synth-2D]
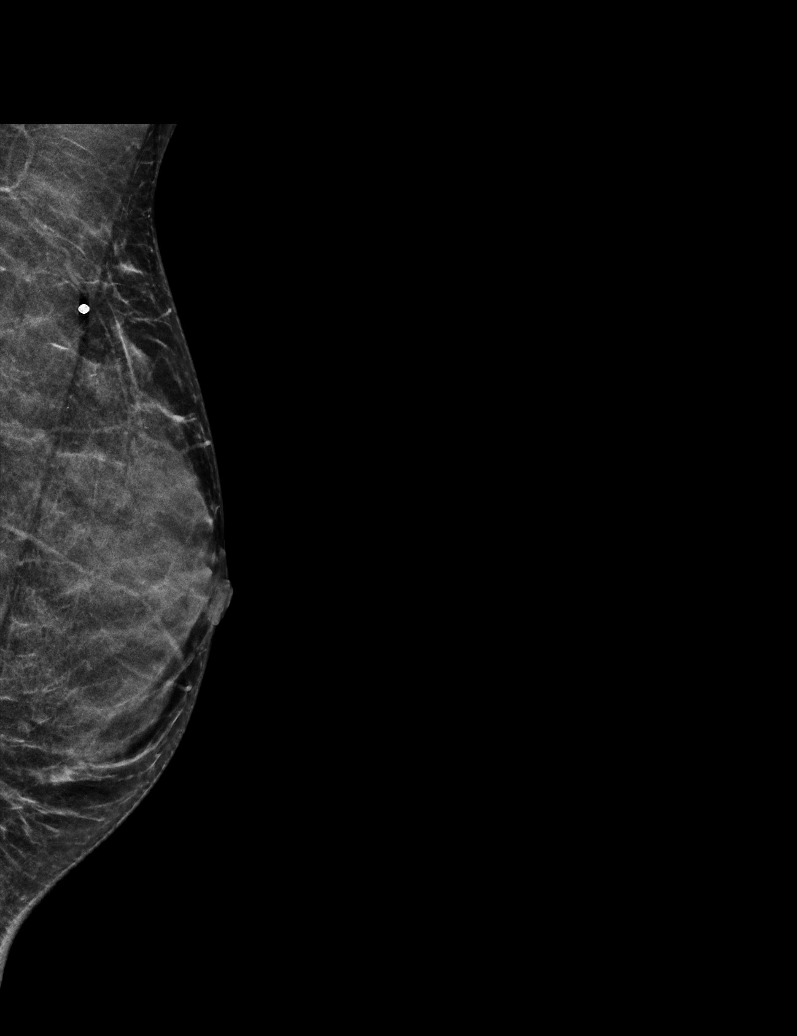

[R MLO synth-2D]
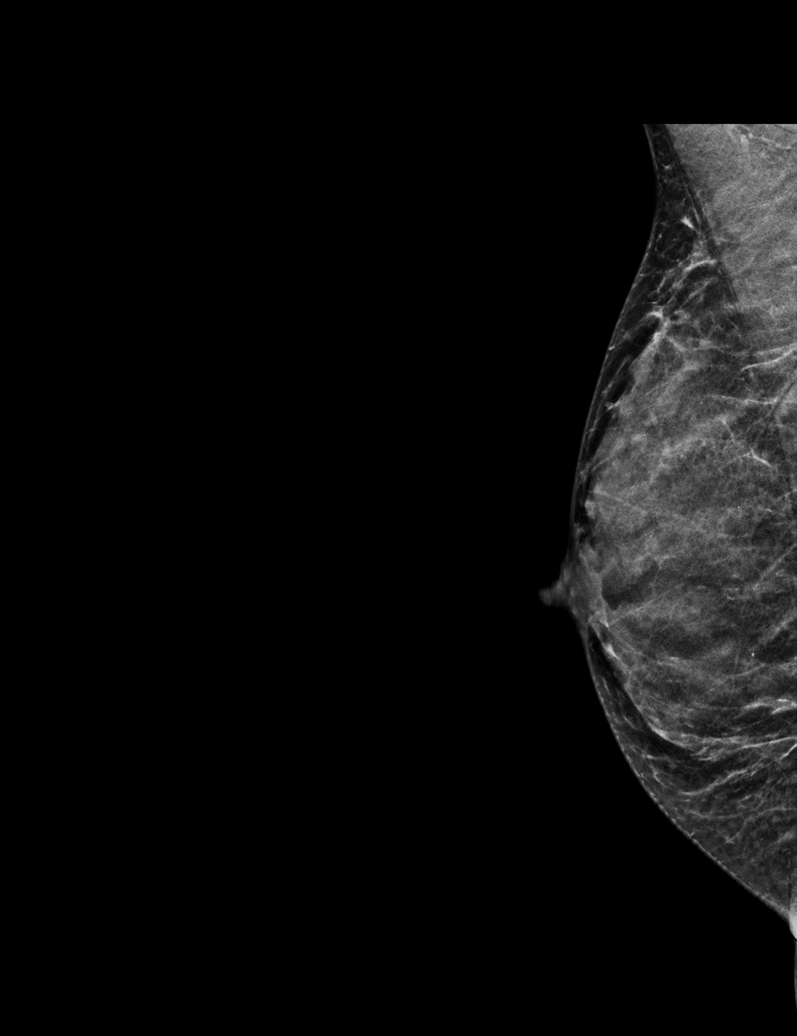

[L CC synth-2D]
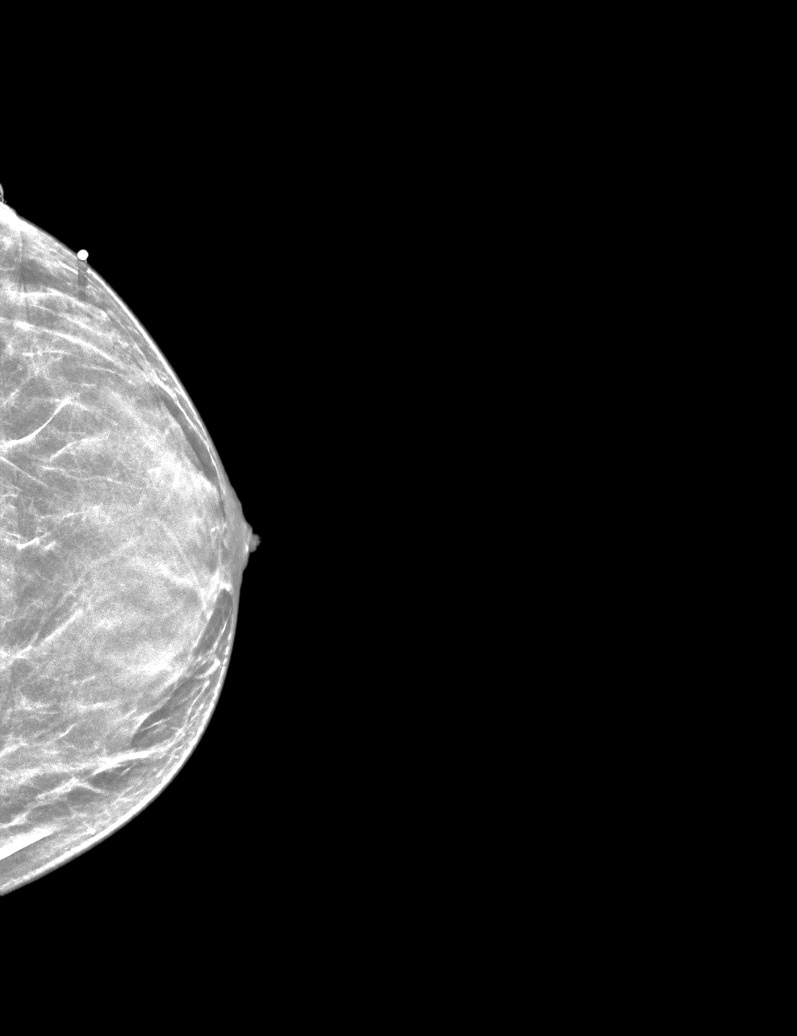

[R CC synth-2D]
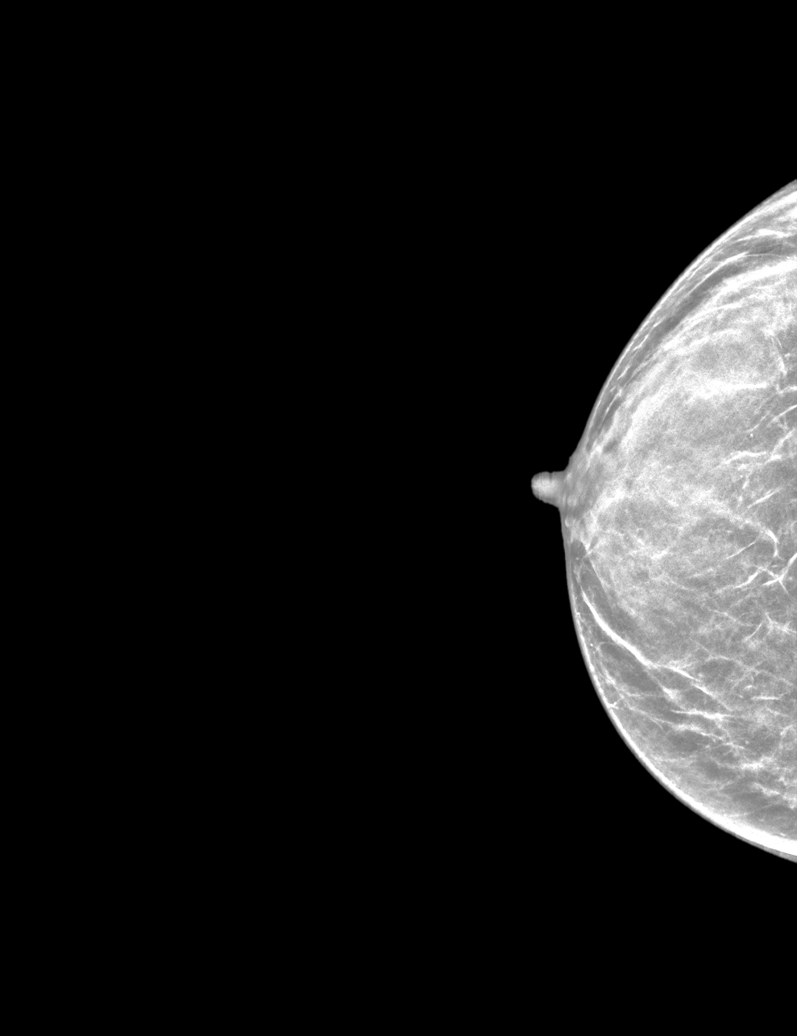

[L MLO tomo · tomo slice 21/40.0]
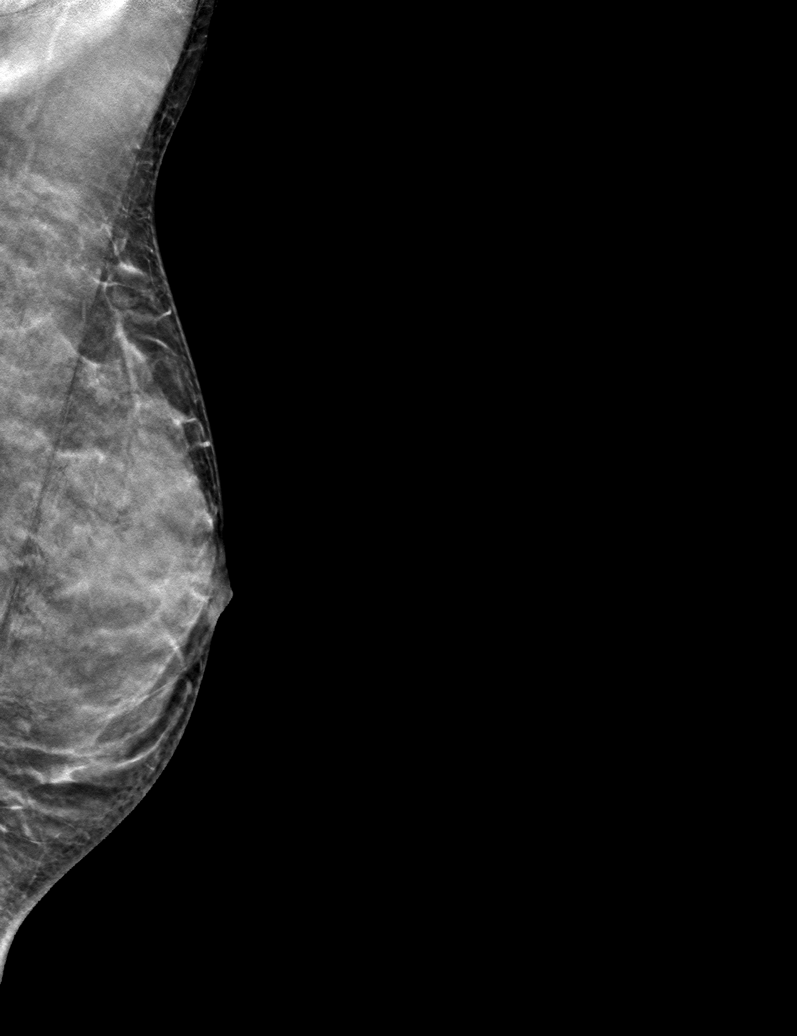

[6 of 30 positions shown; findings below may reference images not displayed]

ACR Breast Density Category d: The breast tissue is extremely dense,
which lowers the sensitivity of mammography.
FINDINGS: Bilateral diagnostic mammogram: There is a partially obscured mass
within the upper-outer quadrant of the LEFT breast, corresponding to
the palpable area of concern. There are no dominant masses,
suspicious calcifications or secondary signs of malignancy elsewhere
within either breast.

Targeted ultrasound is performed, showing an oval circumscribed
hypoechoic mass in the LEFT breast at the 2 o'clock axis, 3 cm from
the nipple, measuring 1.5 x 0.4 x 1.2 cm, slightly decreased in size
compared to the previous LEFT breast ultrasound of 08/10/2016,
corresponding to the palpable area of concern and compatible with a
benign fibroadenoma.

No suspicious solid or cystic mass is identified by ultrasound
within the upper-outer quadrant of the LEFT breast.
IMPRESSION: 1. No evidence of malignancy within either breast.
2. Benign mass within the LEFT breast at the 2 o'clock axis,
measuring 1.5 cm, slightly decreased in size compared to LEFT breast
ultrasound of 08/10/2016, compatible with a benign fibroadenoma and
corresponding to the palpable area of concern. No follow-up imaging
is necessary for this benign finding.

RECOMMENDATION:
Screening mammogram at age 38 unless there are persistent or
intervening clinical concerns. (Code:12-I-07U)

The patient was instructed to return sooner if the area that she
feels becomes larger and/or firmer to palpation, or if a new
palpable abnormality is identified in either breast.

I have discussed the findings and recommendations with the patient.
If applicable, a reminder letter will be sent to the patient
regarding the next appointment.

BI-RADS CATEGORY  2: Benign.

## 2024-09-02 ENCOUNTER — Ambulatory Visit
Admission: EM | Admit: 2024-09-02 | Discharge: 2024-09-02 | Disposition: A | Discharge status: Home / Self Care | Attending: Internal Medicine | Admitting: Internal Medicine

## 2024-09-02 ENCOUNTER — Encounter: Payer: Self-pay | Admitting: *Deleted

## 2024-09-02 DIAGNOSIS — Z113 Encounter for screening for infections with a predominantly sexual mode of transmission: Secondary | ICD-10-CM | POA: Insufficient documentation

## 2024-09-02 DIAGNOSIS — N390 Urinary tract infection, site not specified: Secondary | ICD-10-CM | POA: Diagnosis present

## 2024-09-02 DIAGNOSIS — R300 Dysuria: Secondary | ICD-10-CM

## 2024-09-02 LAB — POCT URINE DIPSTICK
Bilirubin, UA: NEGATIVE
Glucose, UA: NEGATIVE mg/dL
Ketones, POC UA: NEGATIVE mg/dL
Nitrite, UA: NEGATIVE
POC PROTEIN,UA: 30 — AB
Spec Grav, UA: 1.015
Urobilinogen, UA: 0.2 U/dL
pH, UA: 5.5

## 2024-09-02 MED ORDER — NITROFURANTOIN MONOHYD MACRO 100 MG PO CAPS: 100.0000 mg | ORAL_CAPSULE | Freq: Two times a day (BID) | ORAL | 0 refills | Status: AC

## 2024-09-02 NOTE — ED Triage Notes (Signed)
 Pt reports dysuria x 5-6 days. She took AZO with some relief of pain. She is also requesting STI testing

## 2024-09-02 NOTE — Discharge Instructions (Addendum)
 Urinalysis done today. This does show a small amount of blood as well as Sherry Pena blood cells.  This could be consistent with a urinary tract infection especially given the symptoms present.  We will send the urine off for culture to verify if there is bacteria present but will go ahead and start antibiotics.  We recommend the following: Macrobid  100 mg twice daily for 5 days. This is an antibiotic.  Make sure to stay hydrated by drinking plenty of water. Return to urgent care or PCP if symptoms worsen or fail to resolve.    Screening swab and blood work done today and results will be available in 24-48 hours. We will contact you if we need to arrange additional treatment based on your testing. Negative results will be on your MyChart account. Abstain from sex until you receive your final results.  Use a condom for sexual encounters. If you have any worsening or changing symptoms including abnormal discharge, pelvic pain, abdominal pain, fever, nausea, or vomiting, then you should be reevaluated.    Recommend repeating HIV and syphilis testing at 4 to 6 weeks and then again at 3 months due to unknown exposure.

## 2024-09-02 NOTE — ED Provider Notes (Signed)
 " [facility]    CSN: [id number] Arrival date & time: 09/02/24  9160      History   Chief Complaint Chief Complaint  Patient presents with   Dysuria    HPI Sherry Pena is a 33 y.o. female.   33 year old female presents urgent care secondary to dysuria, lower abdominal pain, lower back pain and fevers with chills.  She reports her symptoms started about 4 to 5 days ago.  Yesterday her symptoms were much more severe especially with the abdominal pain and back pain.  She was having a lot of chills yesterday.  Symptoms are little bit better today but she continues to have pressure in her abdomen especially at the end of urination as well as dysuria.  She also has concerns for possible STI exposure.  She reports that she knows that her partner was unfaithful but does not know the status of the person that he was with.  She would like to be tested to ensure she has not had any exposures.  She denies any vaginal discharge.   Dysuria Associated symptoms: no abdominal pain, no fever and no vomiting     Past Medical History:  Diagnosis Date   Anxiety    Gestational hypertension    History of genital warts    HPV (human papilloma virus) infection    Ovarian cyst    Vaginal Pap smear, abnormal     Patient Active Problem List   Diagnosis Date Noted   Maternal anemia, with delivery 11/23/2019   Cesarean delivery delivered - Twins IUGR 36 wks 11/21/2019   Postpartum care following cesarean delivery 3/13 11/21/2019   Gestational hypertension 11/21/2019   Dyshidrotic eczema 03/19/2017    Past Surgical History:  Procedure Laterality Date   CESAREAN SECTION MULTI-GESTATIONAL N/A 11/21/2019   Procedure: Primary CESAREAN SECTION MULTI-GESTATIONAL;  Surgeon: Rutherford Gain, MD;  Location: [facility];  Service: Obstetrics;  Laterality: N/A;  EDD: 12/18/19   genital warts     Laser removal 2011   WISDOM TOOTH EXTRACTION      OB History     Gravida  3   Para  2    Term  1   Preterm  1   AB  1   Living  3      SAB  1   IAB      Ectopic      Multiple  1   Live Births  3            Home Medications    Prior to Admission medications  Medication Sig Start Date End Date Taking? Authorizing Provider  acetaminophen  (TYLENOL ) 500 MG tablet Take 2 tablets (1,000 mg total) by mouth every 6 (six) hours. 11/24/19  Yes Sigmon, Hoy BROCKS, CNM  AUROVELA FE 1/20 1-20 MG-MCG tablet Take 1 tablet by mouth daily. 08/31/24  Yes [provider]  buPROPion (WELLBUTRIN XL) 150 MG 24 hr tablet Take 150 mg by mouth daily. 07/16/24  Yes [provider]  calcium carbonate (TUMS - DOSED IN MG ELEMENTAL CALCIUM) 500 MG chewable tablet Chew 1 tablet by mouth 2 (two) times daily as needed for indigestion or heartburn. Patient not taking: Reported on 09/02/2024    [provider]  coconut oil OIL Apply 1 application topically as needed. Patient not taking: Reported on 09/02/2024 11/24/19   Sigmon, Meredith C, CNM  ibuprofen  (ADVIL ) 800 MG tablet Take 1 tablet (800 mg total) by mouth every 6 (six) hours. Patient  not taking: Reported on 09/02/2024 11/24/19   Sigmon, Meredith C, CNM  iron  polysaccharides (NIFEREX) 150 MG capsule Take 1 capsule (150 mg total) by mouth daily. Patient not taking: Reported on 09/02/2024 11/24/19   Elner Hoy BROCKS, CNM  oxyCODONE  (OXY IR/ROXICODONE ) 5 MG immediate release tablet Take 1-2 tablets (5-10 mg total) by mouth every 4 (four) hours as needed for moderate pain. Patient not taking: Reported on 09/02/2024 11/24/19   Sigmon, Meredith C, CNM  Prenatal Vit-Fe Fumarate-FA (PRENATAL MULTIVITAMIN) TABS tablet Take 1 tablet by mouth daily at 12 noon. Patient not taking: Reported on 09/02/2024    [provider]  senna-docusate (SENOKOT-S) 8.6-50 MG tablet Take 2 tablets by mouth at bedtime as needed for mild constipation. Patient not taking: Reported on 09/02/2024 11/24/19   Elner Hoy BROCKS, CNM     Family History Family History  Problem Relation Age of Onset   Diabetes Maternal Grandmother    Diabetes Paternal Grandmother    Hypertension Paternal Grandmother     Social History Social History[1]   Allergies   Patient has no known allergies.   Review of Systems Review of Systems  Constitutional:  Negative for chills and fever.  HENT:  Negative for ear pain and sore throat.   Eyes:  Negative for pain and visual disturbance.  Respiratory:  Negative for cough and shortness of breath.   Cardiovascular:  Negative for chest pain and palpitations.  Gastrointestinal:  Negative for abdominal pain and vomiting.  Genitourinary:  Positive for dysuria and pelvic pain. Negative for hematuria.  Musculoskeletal:  Positive for back pain. Negative for arthralgias.  Skin:  Negative for color change and rash.  Neurological:  Negative for seizures and syncope.  All other systems reviewed and are negative.    Physical Exam Triage Vital Signs ED Triage Vitals  Encounter Vitals Group     BP 09/02/24 0910 117/83     Girls Systolic BP Percentile --      Girls Diastolic BP Percentile --      Boys Systolic BP Percentile --      Boys Diastolic BP Percentile --      Pulse Rate 09/02/24 0910 74     Resp 09/02/24 0910 18     Temp 09/02/24 0910 98.3 F (36.8 C)     Temp Source 09/02/24 0910 Oral     SpO2 09/02/24 0910 98 %     Weight --      Height --      Head Circumference --      Peak Flow --      Pain Score 09/02/24 0908 6     Pain Loc --      Pain Education --      Exclude from Growth Chart --    No data found.  Updated Vital Signs BP 117/83 (BP Location: Left Arm)   Pulse 74   Temp 98.3 F (36.8 C) (Oral)   Resp 18   LMP 08/25/2024   SpO2 98%   Breastfeeding No   Visual Acuity Right Eye Distance:   Left Eye Distance:   Bilateral Distance:    Right Eye Near:   Left Eye Near:    Bilateral Near:     Physical Exam Vitals and nursing note reviewed.   Constitutional:      General: She is not in acute distress.    Appearance: She is well-developed.  HENT:     Head: Normocephalic and atraumatic.  Eyes:     Conjunctiva/sclera:  Conjunctivae normal.  Cardiovascular:     Rate and Rhythm: Normal rate and regular rhythm.     Heart sounds: No murmur heard. Pulmonary:     Effort: Pulmonary effort is normal. No respiratory distress.     Breath sounds: Normal breath sounds.  Abdominal:     Palpations: Abdomen is soft.     Tenderness: There is no abdominal tenderness.  Musculoskeletal:        General: No swelling.     Cervical back: Neck supple.  Skin:    General: Skin is warm and dry.     Capillary Refill: Capillary refill takes less than 2 seconds.  Neurological:     Mental Status: She is alert.  Psychiatric:        Mood and Affect: Mood normal.      UC Treatments / Results  Labs (all labs ordered are listed, but only abnormal results are displayed) Labs Reviewed  URINE CULTURE  HIV ANTIBODY (ROUTINE TESTING W REFLEX)  SYPHILIS: RPR W/REFLEX TO RPR TITER AND TREPONEMAL ANTIBODIES, TRADITIONAL SCREENING AND DIAGNOSIS ALGORITHM  POCT URINE DIPSTICK  CERVICOVAGINAL ANCILLARY ONLY    EKG   Radiology No results found.  Procedures Procedures (including critical care time)  Medications Ordered in UC Medications - No data to display  Initial Impression / Assessment and Plan / UC Course  I have reviewed the triage vital signs and the nursing notes.  Pertinent labs & imaging results that were available during my care of the patient were reviewed by me and considered in my medical decision making (see chart for details).     Dysuria - Plan: POCT URINE DIPSTICK, POCT URINE DIPSTICK, Urine Culture, Urine Culture  Screening examination for STI - Plan: HIV Antibody (routine testing w rflx), RPR, HIV Antibody (routine testing w rflx), RPR   Urinalysis done today. This does show a small amount of blood as well as Ketina Mars blood  cells.  This could be consistent with a urinary tract infection especially given the symptoms present.  We will send the urine off for culture to verify if there is bacteria present but will go ahead and start antibiotics.  We recommend the following: Macrobid  100 mg twice daily for 5 days. This is an antibiotic.  Make sure to stay hydrated by drinking plenty of water. Return to urgent care or PCP if symptoms worsen or fail to resolve.    Screening swab and blood work done today and results will be available in 24-48 hours. We will contact you if we need to arrange additional treatment based on your testing. Negative results will be on your MyChart account. Abstain from sex until you receive your final results.  Use a condom for sexual encounters. If you have any worsening or changing symptoms including abnormal discharge, pelvic pain, abdominal pain, fever, nausea, or vomiting, then you should be reevaluated.    Recommend repeating HIV and syphilis testing at 4 to 6 weeks and then again at 3 months due to unknown exposure.  Final Clinical Impressions(s) / UC Diagnoses   Final diagnoses:  Dysuria  Screening examination for STI     Discharge Instructions      Urinalysis done today. This does show a small amount of blood as well as Jamail Cullers blood cells.  This could be consistent with a urinary tract infection especially given the symptoms present.  We will send the urine off for culture to verify if there is bacteria present but will go ahead and start antibiotics.  We recommend the following: Macrobid  100 mg twice daily for 5 days. This is an antibiotic.  Make sure to stay hydrated by drinking plenty of water. Return to urgent care or PCP if symptoms worsen or fail to resolve.    Screening swab and blood work done today and results will be available in 24-48 hours. We will contact you if we need to arrange additional treatment based on your testing. Negative results will be on your MyChart  account. Abstain from sex until you receive your final results.  Use a condom for sexual encounters. If you have any worsening or changing symptoms including abnormal discharge, pelvic pain, abdominal pain, fever, nausea, or vomiting, then you should be reevaluated.    Recommend repeating HIV and syphilis testing at 4 to 6 weeks and then again at 3 months due to unknown exposure.     ED Prescriptions   None    PDMP not reviewed this encounter.    [1]  Social History Tobacco Use   Smoking status: Every Day    Current packs/day: 0.30    Average packs/day: 0.3 packs/day for 10.0 years (3.0 ttl pk-yrs)    Types: Cigarettes   Smokeless tobacco: Never  Vaping Use   Vaping status: Never Used  Substance Use Topics   Alcohol use: Not Currently    Alcohol/week: 0.0 standard drinks of alcohol   Drug use: No     Teresa Almarie LABOR, PA-C 09/02/24 9063  "

## 2024-09-03 LAB — HIV ANTIBODY (ROUTINE TESTING W REFLEX): HIV Screen 4th Generation wRfx: NONREACTIVE

## 2024-09-03 LAB — SYPHILIS: RPR W/REFLEX TO RPR TITER AND TREPONEMAL ANTIBODIES, TRADITIONAL SCREENING AND DIAGNOSIS ALGORITHM: RPR Ser Ql: NONREACTIVE

## 2024-09-04 ENCOUNTER — Ambulatory Visit (HOSPITAL_COMMUNITY): Payer: Self-pay

## 2024-09-04 LAB — CERVICOVAGINAL ANCILLARY ONLY
Chlamydia: NEGATIVE
Comment: NEGATIVE
Comment: NEGATIVE
Comment: NORMAL
Neisseria Gonorrhea: NEGATIVE
Trichomonas: NEGATIVE

## 2024-09-04 LAB — URINE CULTURE: Culture: >=100000 — AB

## 2024-09-08 MED ORDER — CEFDINIR 300 MG PO CAPS: 300.0000 mg | ORAL_CAPSULE | Freq: Two times a day (BID) | ORAL | 0 refills | Status: AC
